# Patient Record
Sex: Female | Born: 1990 | Race: Black or African American | Hispanic: No | Marital: Single | Smoking: Never smoker
Health system: Southern US, Community
[De-identification: ages and names within clinical notes are randomized; demographics above are authoritative.]

## PROBLEM LIST (undated history)

## (undated) DIAGNOSIS — I82409 Acute embolism and thrombosis of unspecified deep veins of unspecified lower extremity: Secondary | ICD-10-CM

## (undated) DIAGNOSIS — J45909 Unspecified asthma, uncomplicated: Secondary | ICD-10-CM

## (undated) DIAGNOSIS — L509 Urticaria, unspecified: Secondary | ICD-10-CM

## (undated) HISTORY — DX: Urticaria, unspecified: L50.9

## (undated) HISTORY — PX: TONSILLECTOMY: SUR1361

## (undated) HISTORY — DX: Unspecified asthma, uncomplicated: J45.909

---

## 2012-11-24 ENCOUNTER — Ambulatory Visit (INDEPENDENT_AMBULATORY_CARE_PROVIDER_SITE_OTHER): Payer: BC Managed Care – PPO | Admitting: Internal Medicine

## 2012-11-24 ENCOUNTER — Ambulatory Visit: Payer: BC Managed Care – PPO

## 2012-11-24 VITALS — BP 110/74 | HR 97 | Temp 98.8°F | Resp 17 | Ht 63.0 in | Wt 187.0 lb

## 2012-11-24 DIAGNOSIS — S239XXA Sprain of unspecified parts of thorax, initial encounter: Secondary | ICD-10-CM

## 2012-11-24 DIAGNOSIS — R0602 Shortness of breath: Secondary | ICD-10-CM

## 2012-11-24 DIAGNOSIS — IMO0002 Reserved for concepts with insufficient information to code with codable children: Secondary | ICD-10-CM

## 2012-11-24 DIAGNOSIS — S161XXA Strain of muscle, fascia and tendon at neck level, initial encounter: Secondary | ICD-10-CM

## 2012-11-24 DIAGNOSIS — S139XXA Sprain of joints and ligaments of unspecified parts of neck, initial encounter: Secondary | ICD-10-CM

## 2012-11-24 MED ORDER — HYDROCODONE-ACETAMINOPHEN 5-325 MG PO TABS: 1.0000 | ORAL_TABLET | Freq: Four times a day (QID) | ORAL | Status: DC | PRN

## 2012-11-24 MED ORDER — METHOCARBAMOL 750 MG PO TABS: 750.0000 mg | ORAL_TABLET | Freq: Four times a day (QID) | ORAL | Status: DC

## 2012-11-24 NOTE — Progress Notes (Signed)
  Subjective:    Patient ID: Felicia Lawrence, female    DOB: Aug 22, 1991, 22 y.o.   MRN: [id number]  HPI 2days ago woke up with sharp pain at medial superior scapula. No hx of any trauma. Crying with pain, pain with neck, shoulder rom. Also pain often with deep breathing. No fever or illness. General health has always been good. No sob or cough   Review of Systems Neg    Objective:   Physical Exam  Constitutional: She is oriented to person, place, and time. She appears well-developed and well-nourished. She appears distressed.  Cardiovascular: Normal rate.   Pulmonary/Chest: Effort normal and breath sounds normal.  Musculoskeletal:       Cervical back: She exhibits pain and spasm. She exhibits normal range of motion, no tenderness and no bony tenderness.       Arms: Point tender at x.  Neurological: She is alert and oriented to person, place, and time. She exhibits normal muscle tone. Coordination normal.  Psychiatric: She has a normal mood and affect. Her behavior is normal. Thought content normal.  Spurling causes pain at x. Repeat temp 98.8 Crying with pain  [facility] reading (PRIMARY) by  Dr Perrin Maltese pleuritic subscapular pain cxr nad, possible cardiomegaly         Assessment & Plan:  Thoracic strain Pleuritic thoracic pain Robaxin/HC

## 2012-11-24 NOTE — Patient Instructions (Signed)
Torticollis, Acute You have suddenly (acutely) developed a twisted neck (torticollis). This is usually a self-limited condition. CAUSES  Acute torticollis may be caused by malposition, trauma or infection. Most commonly, acute torticollis is caused by sleeping in an awkward position. Torticollis may also be caused by the flexion, extension or twisting of the neck muscles beyond their normal position. Sometimes, the exact cause may not be known. SYMPTOMS  Usually, there is pain and limited movement of the neck. Your neck may twist to one side. DIAGNOSIS  The diagnosis is often made by physical examination. X-rays, CT scans or MRIs may be done if there is a history of trauma or concern of infection. TREATMENT  For a common, stiff neck that develops during sleep, treatment is focused on relaxing the contracted neck muscle. Medications (including shots) may be used to treat the problem. Most cases resolve in several days. Torticollis usually responds to conservative physical therapy. If left untreated, the shortened and spastic neck muscle can cause deformities in the face and neck. Rarely, surgery is required. HOME CARE INSTRUCTIONS   Use over-the-counter and prescription medications as directed by your caregiver.  Do stretching exercises and massage the neck as directed by your caregiver.  Follow up with physical therapy if needed and as directed by your caregiver. SEEK IMMEDIATE MEDICAL CARE IF:   You develop difficulty breathing or noisy breathing (stridor).  You drool, develop trouble swallowing or have pain with swallowing.  You develop numbness or weakness in the hands or feet.  You have changes in speech or vision.  You have problems with urination or bowel movements.  You have difficulty walking.  You have a fever.  You have increased pain. MAKE SURE YOU:   Understand these instructions.  Will watch your condition.  Will get help right away if you are not doing well or  get worse. Document Released: 09/24/2000 Document Revised: 12/20/2011 Document Reviewed: 11/05/2009 Exit[employer] Patient Information 2013 [employer].  

## 2014-06-13 IMAGING — CR DG CHEST 2V
2 series · 2 of 2 positions shown · non-contrast
Comparison: None.

CLINICAL DATA: Shortness of breath.  Pleuritic subscapular pain.

CHEST - 2 VIEW

[PA]
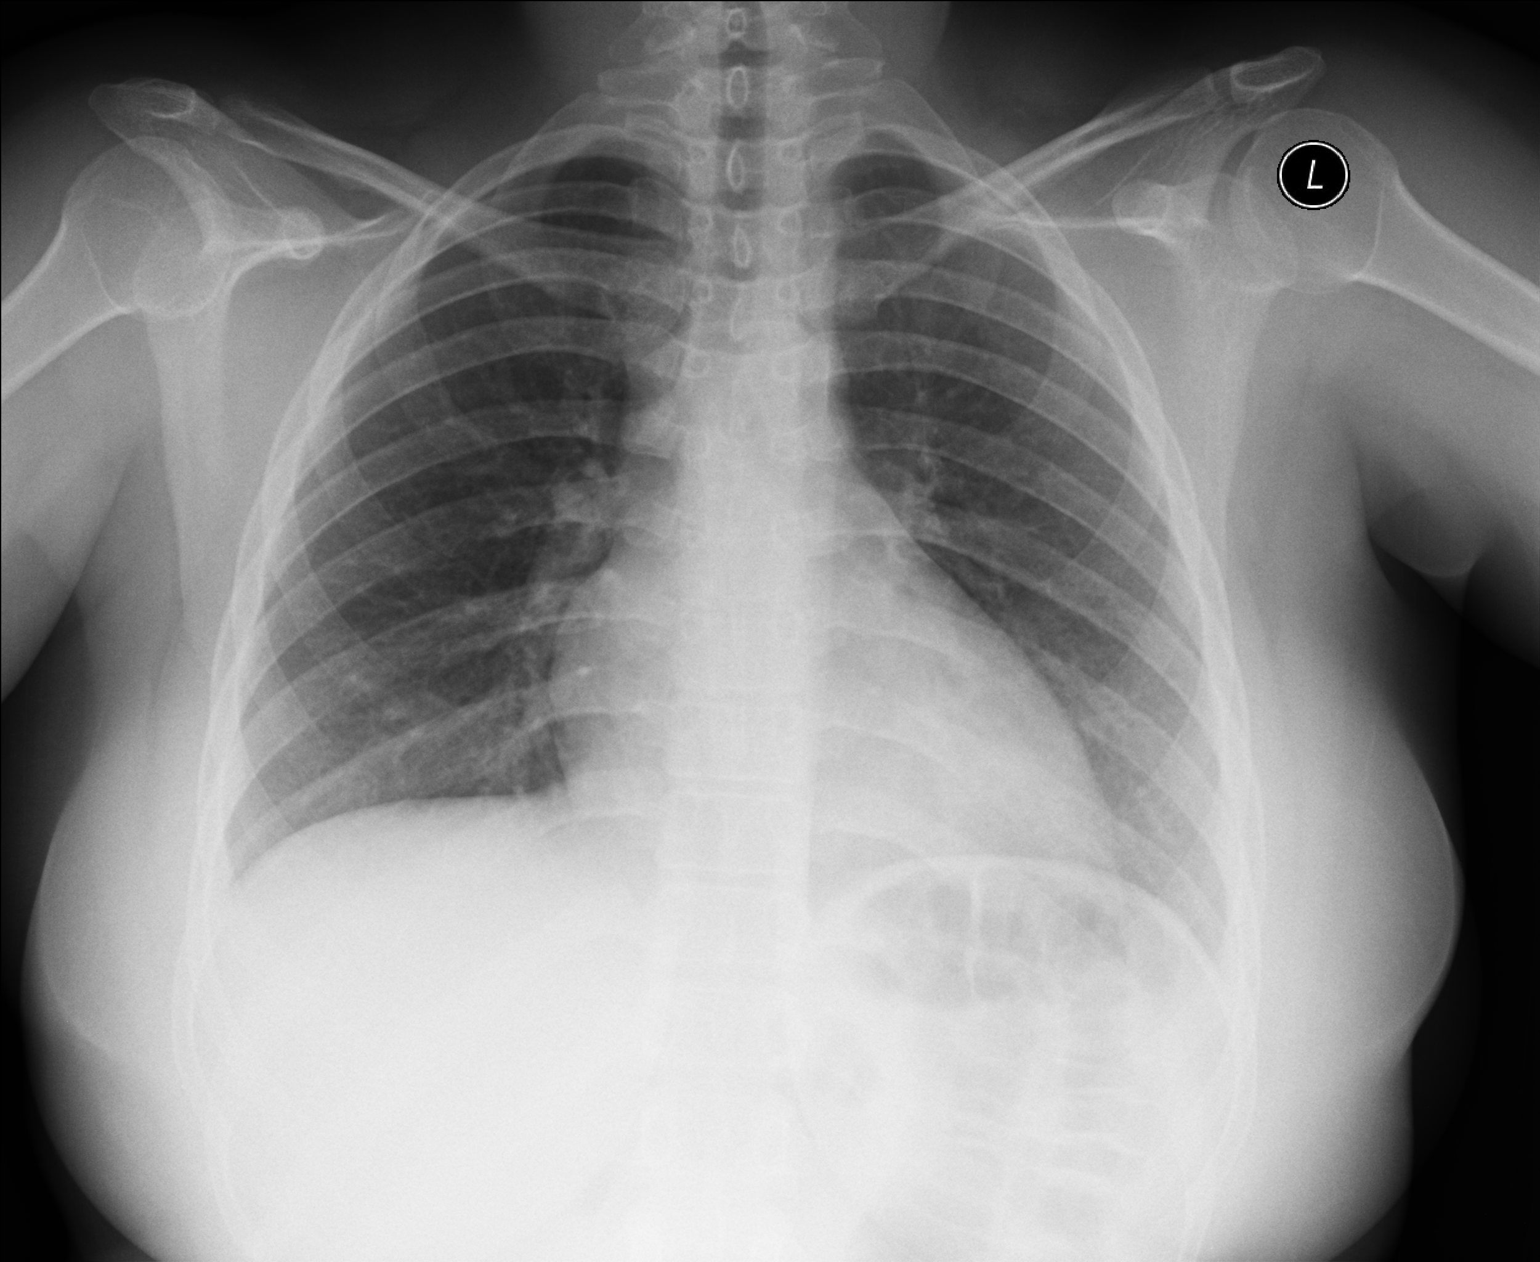

[lateral]
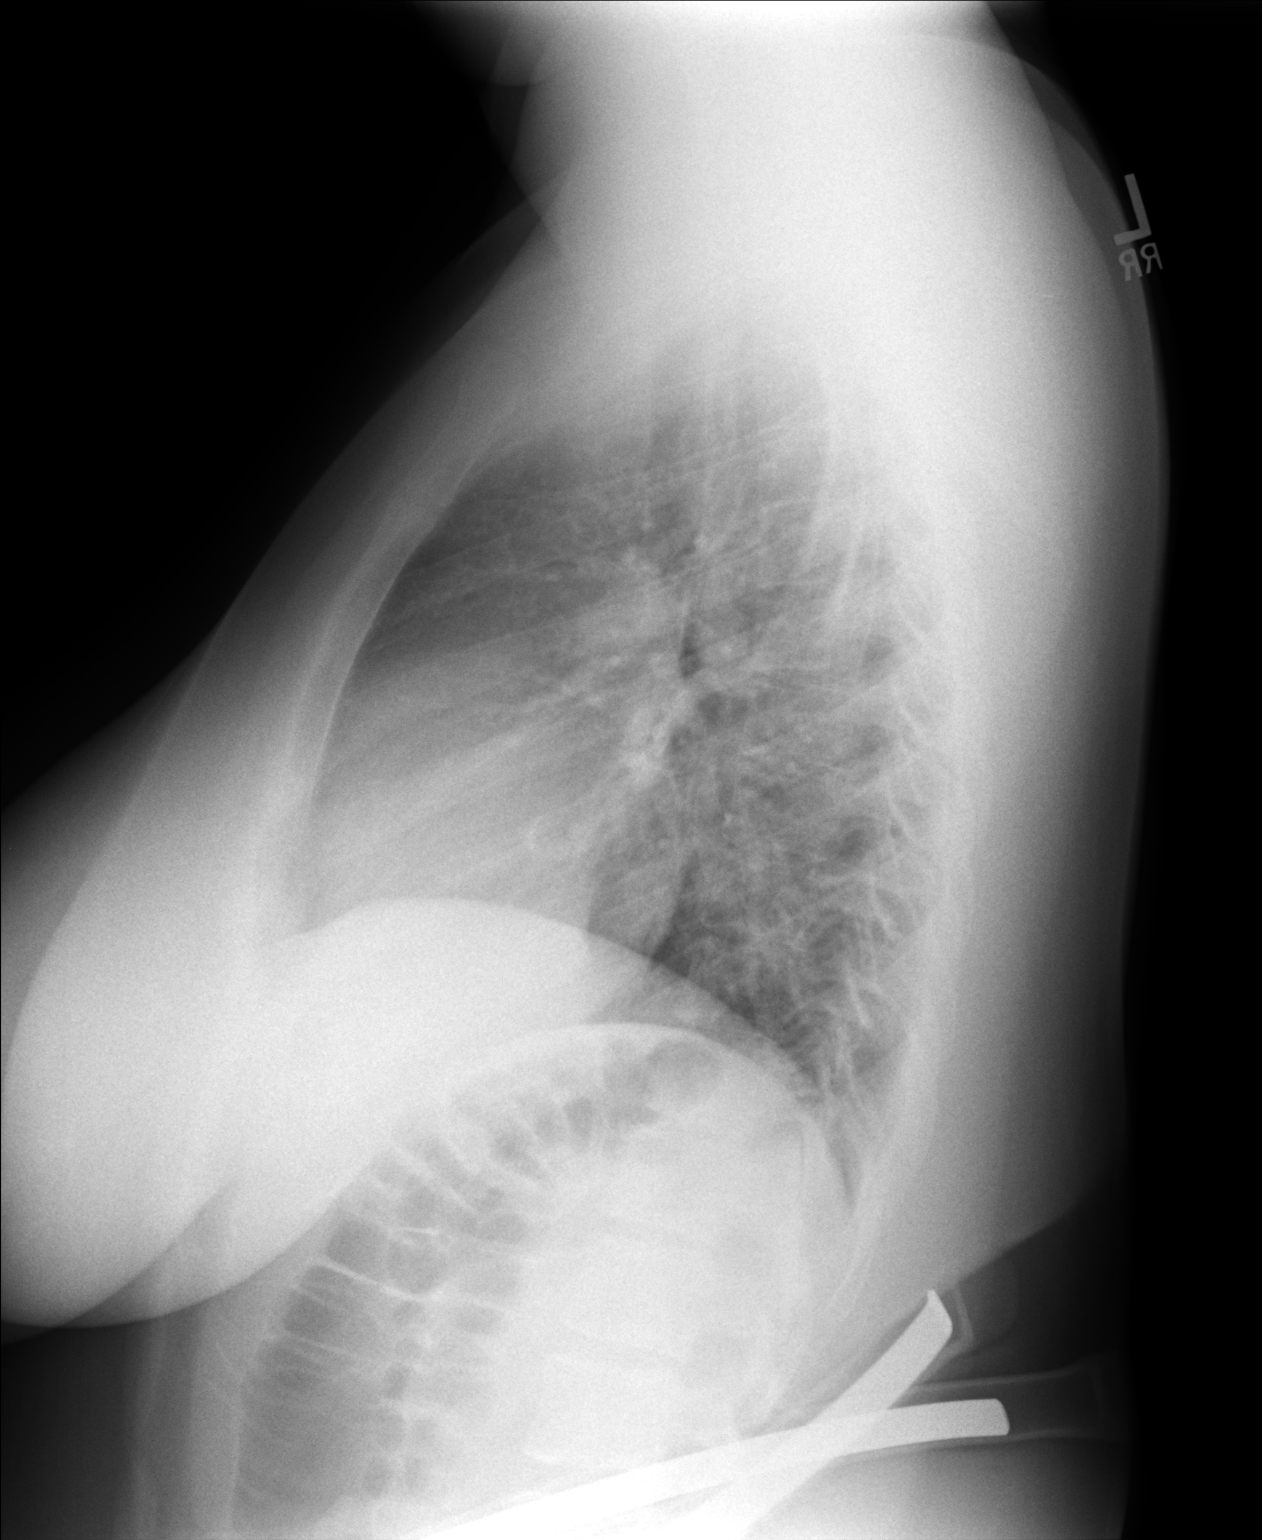

[2 of 2 positions shown; findings below may reference images not displayed]

FINDINGS: Cardiac silhouette is borderline size. No mediastinal or
hilar lesions are seen.  No pulmonary infiltrates or masses were
evident. No pleural abnormality is evident. Bones appear average
for age. .
IMPRESSION: Borderline size of cardiac silhouette.  No pulmonary, pleural, or
skeletal lesions are evident.

## 2017-04-14 ENCOUNTER — Emergency Department (HOSPITAL_COMMUNITY)
Admission: EM | Admit: 2017-04-14 | Discharge: 2017-04-14 | Disposition: A | Discharge status: Home / Self Care | Payer: 59 | Attending: Emergency Medicine | Admitting: Emergency Medicine

## 2017-04-14 ENCOUNTER — Encounter (HOSPITAL_COMMUNITY): Payer: Self-pay | Admitting: Emergency Medicine

## 2017-04-14 ENCOUNTER — Emergency Department (HOSPITAL_BASED_OUTPATIENT_CLINIC_OR_DEPARTMENT_OTHER)
Admit: 2017-04-14 | Discharge: 2017-04-14 | Disposition: A | Discharge status: Home / Self Care | Payer: 59 | Attending: Emergency Medicine | Admitting: Emergency Medicine

## 2017-04-14 DIAGNOSIS — M79661 Pain in right lower leg: Secondary | ICD-10-CM | POA: Diagnosis present

## 2017-04-14 DIAGNOSIS — M79609 Pain in unspecified limb: Secondary | ICD-10-CM

## 2017-04-14 DIAGNOSIS — I82401 Acute embolism and thrombosis of unspecified deep veins of right lower extremity: Secondary | ICD-10-CM

## 2017-04-14 LAB — COMPREHENSIVE METABOLIC PANEL
ALK PHOS: 54 U/L (ref 38–126)
ALT: 15 U/L (ref 14–54)
ANION GAP: 8 (ref 5–15)
AST: 17 U/L (ref 15–41)
Albumin: 3.6 g/dL (ref 3.5–5.0)
BILIRUBIN TOTAL: 0.4 mg/dL (ref 0.3–1.2)
BUN: 8 mg/dL (ref 6–20)
CALCIUM: 8.8 mg/dL — AB (ref 8.9–10.3)
CO2: 25 mmol/L (ref 22–32)
Chloride: 108 mmol/L (ref 101–111)
Creatinine, Ser: 0.62 mg/dL (ref 0.44–1.00)
GFR calc non Af Amer: >60 mL/min (ref >60)
Glucose, Bld: 95 mg/dL (ref 65–99)
POTASSIUM: 3.4 mmol/L — AB (ref 3.5–5.1)
SODIUM: 141 mmol/L (ref 135–145)
TOTAL PROTEIN: 6.6 g/dL (ref 6.5–8.1)

## 2017-04-14 LAB — CBC WITH DIFFERENTIAL/PLATELET
Basophils Absolute: 0 10*3/uL (ref 0.0–0.1)
Basophils Relative: 0 %
EOS ABS: 0.1 10*3/uL (ref 0.0–0.7)
Eosinophils Relative: 1 %
HEMATOCRIT: 38.7 % (ref 36.0–46.0)
HEMOGLOBIN: 12.7 g/dL (ref 12.0–15.0)
LYMPHS ABS: 2.1 10*3/uL (ref 0.7–4.0)
Lymphocytes Relative: 26 %
MCH: 29.7 pg (ref 26.0–34.0)
MCHC: 32.8 g/dL (ref 30.0–36.0)
MCV: 90.4 fL (ref 78.0–100.0)
MONO ABS: 0.5 10*3/uL (ref 0.1–1.0)
Monocytes Relative: 7 %
NEUTROS PCT: 66 %
Neutro Abs: 5.3 10*3/uL (ref 1.7–7.7)
Platelets: 212 10*3/uL (ref 150–400)
RBC: 4.28 MIL/uL (ref 3.87–5.11)
RDW: 12.2 % (ref 11.5–15.5)
WBC: 8.1 10*3/uL (ref 4.0–10.5)

## 2017-04-14 MED ORDER — RIVAROXABAN (XARELTO) VTE STARTER PACK (15 & 20 MG): ORAL_TABLET | ORAL | 0 refills | Status: DC

## 2017-04-14 MED ORDER — RIVAROXABAN 15 MG PO TABS
15.0000 mg | ORAL_TABLET | Freq: Once | ORAL | Status: AC
Start: 2017-04-14 — End: 2017-04-14
  Administered 2017-04-14: 15 mg via ORAL
  Filled 2017-04-14: qty 1

## 2017-04-14 MED ORDER — TRAMADOL HCL 50 MG PO TABS: 50.0000 mg | ORAL_TABLET | Freq: Four times a day (QID) | ORAL | 0 refills | Status: DC | PRN

## 2017-04-14 NOTE — ED Provider Notes (Signed)
[facility]-EMERGENCY DEPT Provider Note   CSN: [id number] Arrival date & time: 04/14/17  1439     History   Chief Complaint Chief Complaint  Patient presents with  . Leg Pain    HPI Felicia Lawrence is a 26 y.o. female.  Patient complains of swelling and pain in her right calf for a number days   The history is provided by the patient. No language interpreter was used.  Leg Pain   This is a new problem. The current episode started more than 2 days ago. The problem occurs constantly. The problem has not changed since onset.Pain location: Right calf. The quality of the pain is described as dull. The pain is at a severity of 5/10. The pain is moderate. Associated symptoms include numbness.    Past Medical History:  Diagnosis Date  . Asthma     There are no active problems to display for this patient.   History reviewed. No pertinent surgical history.  OB History    No data available       Home Medications    Prior to Admission medications   Medication Sig Start Date End Date Taking? Authorizing Provider  diclofenac (VOLTAREN) 50 MG EC tablet Take 50 mg by mouth 2 (two) times daily.   Yes [provider]  folic acid (FOLVITE) 1 MG tablet Take 1 mg by mouth daily.   Yes [provider]  Rivaroxaban 15 & 20 MG TBPK Take as directed on package: Start with one 15mg  tablet by mouth twice a day with food. On Day 22, switch to one 20mg  tablet once a day with food. 04/14/17   Bethann Berkshire, MD  traMADol (ULTRAM) 50 MG tablet Take 1 tablet (50 mg total) by mouth every 6 (six) hours as needed. 04/14/17   Bethann Berkshire, MD    Family History No family history on file.  Social History Social History  Substance Use Topics  . Smoking status: Never Smoker  . Smokeless tobacco: Never Used  . Alcohol use No     Allergies   Patient has no known allergies.   Review of Systems Review of Systems  Constitutional: Negative for appetite change and fatigue.  HENT:  Negative for congestion, ear discharge and sinus pressure.   Eyes: Negative for discharge.  Respiratory: Negative for cough.   Cardiovascular: Negative for chest pain.  Gastrointestinal: Negative for abdominal pain and diarrhea.  Genitourinary: Negative for frequency and hematuria.  Musculoskeletal: Negative for back pain.       Right calf pain  Skin: Negative for rash.  Neurological: Positive for numbness. Negative for seizures and headaches.  Psychiatric/Behavioral: Negative for hallucinations.     Physical Exam Updated Vital Signs BP 116/72 (BP Location: Right Arm)   Pulse 91   Temp 98.7 F (37.1 C) (Oral)   Resp 18   Ht 5\' 3"  (1.6 m)   Wt 80.7 kg (178 lb)   SpO2 100%   BMI 31.53 kg/m   Physical Exam  Constitutional: She is oriented to person, place, and time. She appears well-developed.  HENT:  Head: Normocephalic.  Eyes: Conjunctivae are normal.  Neck: No tracheal deviation present.  Cardiovascular:  No murmur heard. Musculoskeletal: Normal range of motion.  Tender right calf  Neurological: She is oriented to person, place, and time.  Skin: Skin is warm.  Psychiatric: She has a normal mood and affect.     ED Treatments / Results  Labs (all labs ordered are listed, but only abnormal results are  displayed) Labs Reviewed  COMPREHENSIVE METABOLIC PANEL - Abnormal; Notable for the following:       Result Value   Potassium 3.4 (*)    Calcium 8.8 (*)    All other components within normal limits  CBC WITH DIFFERENTIAL/PLATELET    EKG  EKG Interpretation None       Radiology No results found.  Procedures Procedures (including critical care time)  Medications Ordered in ED Medications  Rivaroxaban (XARELTO) tablet 15 mg (not administered)     Initial Impression / Assessment and Plan / ED Course  I have reviewed the triage vital signs and the nursing notes.  Pertinent labs & imaging results that were available during my care of the patient were  reviewed by me and considered in my medical decision making (see chart for details).     Ultrasound shows a DVT in the right calf. Patient will be started on 1210 she's told to go to her OB/GYN to take care of her birth control. Also patient is told to stop taking voltaren,   Pt to follow up with pcp or hematology Final Clinical Impressions(s) / ED Diagnoses   Final diagnoses:  Acute deep vein thrombosis (DVT) of right lower extremity, unspecified vein (HCC)    New Prescriptions New Prescriptions   RIVAROXABAN 15 & 20 MG TBPK    Take as directed on package: Start with one 15mg  tablet by mouth twice a day with food. On Day 22, switch to one 20mg  tablet once a day with food.   TRAMADOL (ULTRAM) 50 MG TABLET    Take 1 tablet (50 mg total) by mouth every 6 (six) hours as needed.     Bethann BerkshireZammit, Ediel Unangst, MD 04/14/17 Kristopher [id number]

## 2017-04-14 NOTE — Discharge Instructions (Signed)
Follow up with a family md next week or follow up with Dr. Darnelle CatalanMagrinat in 1-2 weeks.  Stop taking your voltaren

## 2017-04-14 NOTE — ED Notes (Signed)
Pt c.o right lower leg pain that started about a week ago. States the pain is worse when she walks on it and it relieved when elevated. Pt denies any recent long travels or smoking. Pt does have the nexplanon in place.

## 2017-04-14 NOTE — ED Triage Notes (Signed)
Pt. Stated, my right leg started huting about a week ago and the last 2 days its gotten worse.

## 2017-04-14 NOTE — ED Notes (Signed)
Pt in vascular.

## 2017-04-14 NOTE — Progress Notes (Signed)
VASCULAR LAB PRELIMINARY  PRELIMINARY  PRELIMINARY  PRELIMINARY  Right lower extremity venous duplex completed.    Preliminary report:  There is acute DVT noted throughout the right peroneal vein.    Called report to Dr. Areta HaberZammit  Neelie Welshans, [facility]AND[facility], RVT 04/14/2017, 5:53 PM

## 2017-04-15 ENCOUNTER — Other Ambulatory Visit: Payer: Self-pay | Admitting: Oncology

## 2017-10-10 ENCOUNTER — Emergency Department (HOSPITAL_COMMUNITY)
Admission: EM | Admit: 2017-10-10 | Discharge: 2017-10-10 | Disposition: A | Discharge status: Home / Self Care | Payer: 59

## 2017-10-10 ENCOUNTER — Other Ambulatory Visit: Payer: Self-pay

## 2017-10-11 ENCOUNTER — Emergency Department (HOSPITAL_BASED_OUTPATIENT_CLINIC_OR_DEPARTMENT_OTHER)
Admission: EM | Admit: 2017-10-11 | Discharge: 2017-10-11 | Disposition: A | Discharge status: Home / Self Care | Payer: 59 | Attending: Emergency Medicine | Admitting: Emergency Medicine

## 2017-10-11 ENCOUNTER — Encounter (HOSPITAL_BASED_OUTPATIENT_CLINIC_OR_DEPARTMENT_OTHER): Payer: Self-pay | Admitting: Emergency Medicine

## 2017-10-11 ENCOUNTER — Other Ambulatory Visit: Payer: Self-pay

## 2017-10-11 ENCOUNTER — Emergency Department (HOSPITAL_BASED_OUTPATIENT_CLINIC_OR_DEPARTMENT_OTHER): Payer: 59

## 2017-10-11 DIAGNOSIS — I824Z2 Acute embolism and thrombosis of unspecified deep veins of left distal lower extremity: Secondary | ICD-10-CM

## 2017-10-11 DIAGNOSIS — Z86718 Personal history of other venous thrombosis and embolism: Secondary | ICD-10-CM | POA: Insufficient documentation

## 2017-10-11 DIAGNOSIS — J45909 Unspecified asthma, uncomplicated: Secondary | ICD-10-CM | POA: Diagnosis not present

## 2017-10-11 DIAGNOSIS — Z79899 Other long term (current) drug therapy: Secondary | ICD-10-CM | POA: Insufficient documentation

## 2017-10-11 DIAGNOSIS — I82402 Acute embolism and thrombosis of unspecified deep veins of left lower extremity: Secondary | ICD-10-CM | POA: Insufficient documentation

## 2017-10-11 DIAGNOSIS — M79662 Pain in left lower leg: Secondary | ICD-10-CM | POA: Diagnosis present

## 2017-10-11 HISTORY — DX: Acute embolism and thrombosis of unspecified deep veins of unspecified lower extremity: I82.409

## 2017-10-11 MED ORDER — RIVAROXABAN (XARELTO) EDUCATION KIT FOR DVT/PE PATIENTS: PACK | Freq: Once | Status: DC

## 2017-10-11 MED ORDER — RIVAROXABAN 15 MG PO TABS
15.0000 mg | ORAL_TABLET | Freq: Once | ORAL | Status: AC
  Administered 2017-10-11: 15 mg via ORAL
  Filled 2017-10-11: qty 1

## 2017-10-11 MED ORDER — RIVAROXABAN (XARELTO) VTE STARTER PACK (15 & 20 MG): ORAL_TABLET | ORAL | 0 refills | Status: DC

## 2017-10-11 NOTE — ED Triage Notes (Signed)
L leg pain x 3 days. Hx of DVT in R leg.

## 2017-10-11 NOTE — ED Provider Notes (Signed)
[facility] EMERGENCY DEPARTMENT Provider Note   CSN: [id number] Arrival date & time: 10/11/17  1806     History   Chief Complaint Chief Complaint  Patient presents with  . Leg Pain    HPI Felicia Lawrence is a 27 y.o. female.  HPI  27 year old female presents with left calf pain starting 3 days ago.  Pain does not radiate anywhere and there is no knee or thigh pain.  It is posterior and she thinks it has been a little bit red.  She had a DVT about 6 months ago.  She was told to follow-up with a hematologist but when she called they told her that that particular doctor does not follow-up DVTs.  She took the Xarelto starter pack but did not take more than 1 month.  She does not have a primary care doctor.  She has not had any chest pain or shortness of breath.  No weakness or numbness.  She used to be on birth control but was taken off of this when she had the DVT the first time.  Her mom and her mother's father both had a history of blood clots.  Past Medical History:  Diagnosis Date  . Asthma   . DVT (deep venous thrombosis) ([place])     There are no active problems to display for this patient.   History reviewed. No pertinent surgical history.  OB History    No data available       Home Medications    Prior to Admission medications   Medication Sig Start Date End Date Taking? Authorizing Provider  folic acid (FOLVITE) 1 MG tablet Take 1 mg by mouth daily.    [provider]  Rivaroxaban 15 & 20 MG TBPK Take as directed on package: Start with one 57m tablet by mouth twice a day with food. On Day 22, switch to one 28mtablet once a day with food. 10/11/17   GoSherwood GamblerMD  traMADol (ULTRAM) 50 MG tablet Take 1 tablet (50 mg total) by mouth every 6 (six) hours as needed. 04/14/17   ZaMilton FergusonMD    Family History No family history on file.  Social History Social History   Tobacco Use  . Smoking status: Never Smoker  . Smokeless tobacco:  Never Used  Substance Use Topics  . Alcohol use: No  . Drug use: No     Allergies   Patient has no known allergies.   Review of Systems Review of Systems  Respiratory: Negative for shortness of breath.   Cardiovascular: Negative for chest pain and leg swelling.  Musculoskeletal: Positive for myalgias.  Skin: Positive for color change.  Neurological: Negative for weakness and numbness.  All other systems reviewed and are negative.    Physical Exam Updated Vital Signs BP 113/75 (BP Location: Right Arm)   Pulse 81   Temp 98.2 F (36.8 C) (Oral)   Resp 18   Ht 5' 3" (1.6 m)   Wt 74.8 kg (165 lb)   LMP 09/27/2017   SpO2 99%   BMI 29.23 kg/m   Physical Exam  Constitutional: She is oriented to person, place, and time. She appears well-developed and well-nourished.  HENT:  Head: Normocephalic and atraumatic.  Right Ear: External ear normal.  Left Ear: External ear normal.  Nose: Nose normal.  Eyes: Right eye exhibits no discharge. Left eye exhibits no discharge.  Cardiovascular: Normal rate and regular rhythm.  Pulses:      Dorsalis pedis pulses  are 2+ on the right side, and 2+ on the left side.  Pulmonary/Chest: Effort normal.  Musculoskeletal:       Left knee: She exhibits no swelling. No tenderness found.       Left upper leg: She exhibits no tenderness and no swelling.       Left lower leg: She exhibits tenderness and swelling. She exhibits no bony tenderness.       Legs: Neurological: She is alert and oriented to person, place, and time.  Skin: Skin is warm and dry.  Nursing note and vitals reviewed.    ED Treatments / Results  Labs (all labs ordered are listed, but only abnormal results are displayed) Labs Reviewed - No data to display  EKG  EKG Interpretation None       Radiology US Venous Img Lower Unilateral Left  Result Date: 10/11/2017 CLINICAL DATA:  Left calf pain for 3 days. EXAM: LEFT LOWER EXTREMITY VENOUS DOPPLER ULTRASOUND TECHNIQUE:  Gray-scale sonography with graded compression, as well as color Doppler and duplex ultrasound were performed to evaluate the lower extremity deep venous systems from the level of the common femoral vein and including the common femoral, femoral, profunda femoral, popliteal and calf veins including the posterior tibial, peroneal and gastrocnemius veins when visible. The superficial great saphenous vein was also interrogated. Spectral Doppler was utilized to evaluate flow at rest and with distal augmentation maneuvers in the common femoral, femoral and popliteal veins. COMPARISON:  None. FINDINGS: Contralateral Common Femoral Vein: Respiratory phasicity is normal and symmetric with the symptomatic side. No evidence of thrombus. Normal compressibility. Common Femoral Vein: No evidence of thrombus. Normal compressibility, respiratory phasicity and response to augmentation. Saphenofemoral Junction: No evidence of thrombus. Normal compressibility and flow on color Doppler imaging. Profunda Femoral Vein: No evidence of thrombus. Normal compressibility and flow on color Doppler imaging. Femoral Vein: No evidence of thrombus. Normal compressibility, respiratory phasicity and response to augmentation. Popliteal Vein: No evidence of thrombus. Normal compressibility, respiratory phasicity and response to augmentation. Calf Veins: No evidence of thrombus. Normal compressibility and flow on color Doppler imaging. Venous Reflux:  None. Other Findings: There is thrombus in the gastrocnemius seen at the junction with popliteal vein extending to the upper to mid calf. IMPRESSION: Positive thrombus in the left gastrocnemius vein seen at the junction with the popliteal vein extending to the upper to mid calf. Electronically Signed   By: Abelardo Diesel M.D.   On: 10/11/2017 19:35    Procedures Procedures (including critical care time)  Medications Ordered in ED Medications  rivaroxaban [facility] Reasons) Education Kit for DVT/PE patients  (not administered)  Rivaroxaban (XARELTO) tablet 15 mg (15 mg Oral Given 10/11/17 2138)     Initial Impression / Assessment and Plan / ED Course  I have reviewed the triage vital signs and the nursing notes.  Pertinent labs & imaging results that were available during my care of the patient were reviewed by me and considered in my medical decision making (see chart for details).     Patient presents with a recurrent clot.  While this clot is distal in the gastrocnemius vein, I think that she needs treatment with Xarelto given this is a recurrent clot.  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  She did not get fully treated as she only used 1 month of a blood thinner last time.  No clear etiology at this time as she has not had any travel, surgery not  on estrogen.  This could be hereditary given family history.  She does not have any signs or symptoms concerning for PE at this time.  She was given another starter pack for Xarelto and given resources to help find a PCP.  Discussed return precautions.  Final Clinical Impressions(s) / ED Diagnoses   Final diagnoses:  DVT, lower extremity, distal, acute, left [facility])    ED Discharge Orders        Ordered    Rivaroxaban 15 & 20 MG TBPK     10/11/17 2128       Sherwood Gambler, MD 10/11/17 2146

## 2017-10-11 NOTE — Discharge Instructions (Signed)
It is very important to find a primary care doctor to help workup why you are having blood clots.  Use the number provided in your discharge paperwork to help find a doctor.  If you develop chest pain or shortness of breath return to the ER or call 911 to be seen immediately.  Do not take aspirin, ibuprofen, Motrin, Aleve, or other NSAIDs while you are on this blood thinner.  You need to find a doctor ASAP because you likely need to be on this blood thinner for at least 3 or 6 months.

## 2017-10-12 MED FILL — XARELTO STARTER PACK: 15 & 20 | 30 days supply | Qty: 51 | Fill #0

## 2017-10-13 ENCOUNTER — Ambulatory Visit: Payer: 59 | Admitting: Medical

## 2017-10-13 ENCOUNTER — Encounter: Payer: Self-pay | Admitting: Medical

## 2017-10-13 VITALS — BP 111/65 | HR 79 | Temp 98.4°F | Resp 16 | Ht 63.0 in | Wt 167.6 lb

## 2017-10-13 DIAGNOSIS — I824Y2 Acute embolism and thrombosis of unspecified deep veins of left proximal lower extremity: Secondary | ICD-10-CM

## 2017-10-13 MED ORDER — RIVAROXABAN 20 MG PO TABS: 20.0000 mg | ORAL_TABLET | Freq: Every day | ORAL | 1 refills | Status: DC

## 2017-10-13 NOTE — Progress Notes (Signed)
Subjective:    Patient ID: Felicia Lawrence, female    DOB: 04/10/1991, 27 y.o.   MRN: [id number]  HPI   Pt in for first time.  Pt works Biochemist, clinicaldetention officer, she works out 3 days a week, nonsmoker, no alcohol. Not married.  LMP- 2 weeks and normal.(no birth control tablets). Came on at expected normal.  Pt in states in past July she had DVT. In the past she was on xarelto for about a month and half. She never followed up with hematologist. In  July ED summary reads.  Ultrasound shows a DVT in the right calf. Patient will be started on 1210 she's told to go to her OB/GYN to take care of her birth control. Also patient is told to stop taking voltaren,   Pt to follow up with pcp or hematology.    Then just recently she had some pain in her left calf for 2-3 days. She state pain in her calf with just walking. So she went to ED and was evaluated The most recent US read possible DVT. She was then put back on xarelto starter pack for one month.   Positive thrombus in the left gastrocnemius vein seen at the junction with the popliteal vein extending to the upper to mid calf.   Pt has been off ocp since summer. No recent trauma, no long trips and not a smoker. Mom had dvt and is on coumadin.    Review of Systems  Constitutional: Negative for chills, diaphoresis and fatigue.  HENT: Negative for congestion.   Respiratory: Negative for cough, chest tightness, shortness of breath and wheezing.   Cardiovascular: Negative for chest pain and palpitations.  Gastrointestinal: Negative for abdominal pain, diarrhea and nausea.  Musculoskeletal: Negative for back pain, gait problem and neck stiffness.       Less pain in calf but some less than when in ED.  Skin: Negative for rash.  Neurological: Negative for dizziness, seizures, weakness and headaches.  Hematological: Negative for adenopathy. Does not bruise/bleed easily.  Psychiatric/Behavioral: Negative for behavioral problems and confusion.     Past Medical History:  Diagnosis Date  . Asthma   . DVT (deep venous thrombosis) (HCC)      Social History   Socioeconomic History  . Marital status: Single    Spouse name: Not on file  . Number of children: Not on file  . Years of education: Not on file  . Highest education level: Not on file  Social Needs  . Financial resource strain: Not on file  . Food insecurity - worry: Not on file  . Food insecurity - inability: Not on file  . Transportation needs - medical: Not on file  . Transportation needs - non-medical: Not on file  Occupational History  . Not on file  Tobacco Use  . Smoking status: Never Smoker  . Smokeless tobacco: Never Used  Substance and Sexual Activity  . Alcohol use: No  . Drug use: No  . Sexual activity: Yes    Birth control/protection: None  Other Topics Concern  . Not on file  Social History Narrative  . Not on file    No past surgical history on file.  Family History  Problem Relation Age of Onset  . Clotting disorder Mother     No Known Allergies  Current Outpatient Medications on File Prior to Visit  Medication Sig Dispense Refill  . folic acid (FOLVITE) 1 MG tablet Take 1 mg by mouth daily.    .Marland Kitchen  Rivaroxaban 15 & 20 MG TBPK Take as directed on package: Start with one 15mg  tablet by mouth twice a day with food. On Day 22, switch to one 20mg  tablet once a day with food. 51 each 0  . traMADol (ULTRAM) 50 MG tablet Take 1 tablet (50 mg total) by mouth every 6 (six) hours as needed. 15 tablet 0   No current facility-administered medications on file prior to visit.     BP 111/65   Pulse 79   Temp 98.4 F (36.9 C) (Oral)   Resp 16   Ht 5\' 3"  (1.6 m)   Wt 167 lb 9.6 oz (76 kg)   LMP 09/27/2017   SpO2 100%   BMI 29.69 kg/m       Objective:   Physical Exam  General- No acute distress. Pleasant patient. Neck- Full range of motion, no jvd Lungs- Clear, even and unlabored. Heart- regular rate and rhythm. Neurologic- CNII-  XII grossly intact.  Left lower ext- faint tender mid lower calf. Not swollen, no warm or tender. Mild pain in this area on homans testing.  Rt leg- no calf tenderness and negative homan sign.           Assessment & Plan:  For your history of DVT recently and in the past, I want you to continue with the Xarelto starter pack but go ahead and try to fill the prescription of Xarelto I gave you today.  You would use my prescription as you run out of current pack.  I will also will go ahead and refer you to hematology for workup of cause of recurrent DVTs as we do not have recent suspicious history for known causes.  You will likely get workup to determine the cause.  I have placed a referral but if no one calls you within 10-14 days then recommend you call here and asked to speak to [facility].  She cannot investigate/update you on the referral.  Follow-up with me as needed before after specialist appointment.  Abegail Kloeppel, Ramon Dredge, PA-C

## 2017-10-13 NOTE — Patient Instructions (Addendum)
For your history of DVT recently and in the past, I want you to continue with the Xarelto starter pack but go ahead and try to fill the prescription of Xarelto I gave you today.  You would use my prescription as you run out of current pack.  I will also will go ahead and refer you to hematology for workup of cause of recurrent DVTs as you  do not have recent suspicious history for known causes.  You will likely get workup to determine the cause.  I have placed a referral but if no one calls you within 10-14 days then recommend you call here and [facility].  She cannot investigate/update you on the referral.  Follow-up with me as needed before after specialist appointment.

## 2017-11-01 ENCOUNTER — Other Ambulatory Visit: Payer: 59

## 2017-11-01 ENCOUNTER — Ambulatory Visit: Payer: 59 | Admitting: Family

## 2017-11-14 ENCOUNTER — Ambulatory Visit: Payer: 59 | Admitting: Medical

## 2017-11-18 ENCOUNTER — Inpatient Hospital Stay: Payer: 59 | Attending: Family | Admitting: Hematology & Oncology

## 2017-11-18 ENCOUNTER — Inpatient Hospital Stay: Payer: 59

## 2017-11-18 ENCOUNTER — Other Ambulatory Visit: Payer: Self-pay | Admitting: Family

## 2017-11-18 ENCOUNTER — Other Ambulatory Visit: Payer: Self-pay

## 2017-11-18 ENCOUNTER — Encounter: Payer: Self-pay | Admitting: Hematology & Oncology

## 2017-11-18 VITALS — BP 109/72 | HR 66 | Temp 98.4°F | Resp 18 | Wt 168.0 lb

## 2017-11-18 DIAGNOSIS — Z832 Family history of diseases of the blood and blood-forming organs and certain disorders involving the immune mechanism: Secondary | ICD-10-CM | POA: Insufficient documentation

## 2017-11-18 DIAGNOSIS — Z79899 Other long term (current) drug therapy: Secondary | ICD-10-CM | POA: Insufficient documentation

## 2017-11-18 DIAGNOSIS — Z86718 Personal history of other venous thrombosis and embolism: Secondary | ICD-10-CM

## 2017-11-18 DIAGNOSIS — Z7901 Long term (current) use of anticoagulants: Secondary | ICD-10-CM | POA: Diagnosis not present

## 2017-11-18 DIAGNOSIS — K645 Perianal venous thrombosis: Secondary | ICD-10-CM | POA: Diagnosis not present

## 2017-11-18 DIAGNOSIS — I824Z2 Acute embolism and thrombosis of unspecified deep veins of left distal lower extremity: Secondary | ICD-10-CM | POA: Insufficient documentation

## 2017-11-18 LAB — CMP (CANCER CENTER ONLY)
ALK PHOS: 59 U/L (ref 40–150)
ALT: 33 U/L (ref 0–55)
ANION GAP: 8 (ref 3–11)
AST: 24 U/L (ref 5–34)
Albumin: 3.7 g/dL (ref 3.5–5.0)
BUN: 12 mg/dL (ref 7–26)
CALCIUM: 8.9 mg/dL (ref 8.4–10.4)
CO2: 27 mmol/L (ref 22–29)
Chloride: 106 mmol/L (ref 98–109)
Creatinine: 0.64 mg/dL (ref 0.60–1.10)
GFR, Estimated: >60 mL/min (ref >60)
GLUCOSE: 80 mg/dL (ref 70–140)
POTASSIUM: 3.9 mmol/L (ref 3.5–5.1)
SODIUM: 141 mmol/L (ref 136–145)
Total Bilirubin: 0.3 mg/dL (ref 0.2–1.2)
Total Protein: 6.7 g/dL (ref 6.4–8.3)

## 2017-11-18 LAB — CBC WITH DIFFERENTIAL (CANCER CENTER ONLY)
BASOS PCT: 0 %
Basophils Absolute: 0 10*3/uL (ref 0.0–0.1)
Eosinophils Absolute: 0.1 10*3/uL (ref 0.0–0.5)
Eosinophils Relative: 2 %
HEMATOCRIT: 35.9 % (ref 34.8–46.6)
HEMOGLOBIN: 11.7 g/dL (ref 11.6–15.9)
LYMPHS PCT: 32 %
Lymphs Abs: 1.6 10*3/uL (ref 0.9–3.3)
MCH: 29.4 pg (ref 26.0–34.0)
MCHC: 32.6 g/dL (ref 32.0–36.0)
MCV: 90.2 fL (ref 81.0–101.0)
MONO ABS: 0.4 10*3/uL (ref 0.1–0.9)
Monocytes Relative: 8 %
NEUTROS ABS: 3 10*3/uL (ref 1.5–6.5)
NEUTROS PCT: 58 %
Platelet Count: 243 10*3/uL (ref 145–400)
RBC: 3.98 MIL/uL (ref 3.70–5.32)
RDW: 11.7 % (ref 11.1–15.7)
WBC Count: 5.2 10*3/uL (ref 3.9–10.0)

## 2017-11-18 LAB — ANTITHROMBIN III: AntiThromb III Func: 95 % (ref 75–120)

## 2017-11-18 MED ORDER — RIVAROXABAN 20 MG PO TABS: 20.0000 mg | ORAL_TABLET | Freq: Every day | ORAL | 12 refills | Status: DC

## 2017-11-18 MED FILL — XARELTO 20 MG TABLET: 20 | 30 days supply | Qty: 30 | Fill #0

## 2017-11-18 NOTE — Progress Notes (Signed)
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  Chief Complaint  Patient presents with  . New Patient (Initial Visit)  : I am had blood clots in my legs.  HPI: Felicia Lawrence is a very nice 27 year old African-American female.  [place]  She [place] is working [facility] [employer] [place].  She is a Biochemist, clinicaldetention officer.  She is in very good shape.  She exercises.  She is never smoked.  She had been on contraceptive medication with a implantable drug.  Back in July 2018, she woke up and had some pain in the right leg.  She thought that she may have pulled a muscle.  This did not get better.  She ultimately went to urgent care.  She had a Doppler done.  Surprisingly, she had a thrombus in the RIGHT peroneal vein.  She was placed on Xarelto.  However, there is some Ms. communication with her.  She was not given any follow-up.  She only took the Xarelto starter pack.  At that time, she had the implantable contraceptive drug removed.  Of note, she has never been pregnant.  There is a history of blood clots with her mother and grandfather.  She then had pain and swelling in the left leg.  This was around Christmas time.  This did not improve on its own.  She subsequently had another Doppler done.  This time, she had a thrombus in the LEFT gastrocnemius vein.  She was again placed on Xarelto.  However, [facility] was [facility].  She is feeling okay.  There is no pain in the legs.  She has had no swelling in the legs.  She does have her monthly cycles.  They are not too heavy.  She has had no cough or shortness of breath.  She has had no chest wall pain.  She does not smoke.  She does enjoy a glass of wine on occasion.  There is no history of diabetes.  She has not had any recent  trips.  There is really no past surgery.  There is no history of sickle cell.  Currently, her performance status is ECOG 0.   Past Medical History:  Diagnosis Date  . Asthma    only as a child.  Marland Kitchen. DVT (deep venous thrombosis) (HCC)   :  History reviewed. No pertinent surgical history.:   Current Outpatient Medications:  .  folic acid (FOLVITE) 1 MG tablet, Take 1 mg by mouth daily., Disp: , Rfl:  .  rivaroxaban (XARELTO) 20 MG TABS tablet, Take 1 tablet (20 mg total) by mouth daily with supper., Disp: 30 tablet, Rfl: 12 .  Rivaroxaban 15 & 20 MG TBPK, Take as directed on package: Start with one 15mg  tablet by mouth twice a day with food. On Day 22, switch to one 20mg  tablet once a day with food., Disp: 51 each, Rfl: 0 .  traMADol (ULTRAM) 50 MG tablet, Take 1 tablet (50 mg total) by mouth every 6 (six) hours as needed., Disp: 15 tablet, Rfl: 0:  :  No Known Allergies:  Family History  Problem Relation Age of Onset  . Clotting disorder Mother   :  Social History   Socioeconomic History  . Marital status: Single    Spouse name: Not on file  . Number of children: Not  on file  . Years of education: Not on file  . Highest education level: Not on file  Social Needs  . Financial resource strain: Not on file  . Food insecurity - worry: Not on file  . Food insecurity - inability: Not on file  . Transportation needs - medical: Not on file  . Transportation needs - non-medical: Not on file  Occupational History  . Not on file  Tobacco Use  . Smoking status: Never Smoker  . Smokeless tobacco: Never Used  Substance and Sexual Activity  . Alcohol use: No  . Drug use: No  . Sexual activity: Yes    Birth control/protection: None  Other Topics Concern  . Not on file  Social History Narrative  . Not on file  :  Review of Systems  Constitutional: Negative.   HENT: Negative.   Eyes: Negative.   Respiratory: Negative.   Cardiovascular: Negative.   Gastrointestinal:  Negative.   Genitourinary: Negative.   Musculoskeletal: Negative.   Skin: Negative.  Negative for rash.  Neurological: Negative.   Endo/Heme/Allergies: Negative.   Psychiatric/Behavioral: Negative.      Exam: Well-developed and well-nourished African-American female in no obvious distress.  Vital signs show a temperature of 98.4.  Pulse 66.  Blood pressure 109/72.  Weight is 168 pounds.  Head neck exam shows no ocular or oral lesions.  She has no palpable cervical or supraclavicular lymph nodes.  Lungs are clear bilaterally.  Cardiac exam regular rate and rhythm with no murmurs, rubs or bruits.  Abdomen is soft.  She has good bowel sounds.  There is no fluid wave.  There is no palpable liver or spleen tip.  Back exam shows no tenderness over the spine, ribs or hips.  Extremities shows no clubbing, cyanosis or edema.  She has good range of motion of her joints.  No palpable venous cord is noted.  She has a negative Homans sign bilaterally.  Skin exam shows no rashes, ecchymoses or petechia.  Neurological exam shows no focal neurological deficits. @IPVITALS @   Recent Labs    11/18/17 1252  WBC 5.2  HCT 35.9  PLT 243   No results for input(s): NA, K, CL, CO2, GLUCOSE, BUN, CREATININE, CALCIUM in the last 72 hours.  Blood smear review: None  Pathology: None    Assessment and Plan: Felicia Lawrence is a very nice 27 year old African-American female.  She initially presented with a thrombus in the right lower leg back in July 2018.  Again, she was only on anticoagulation for 6 weeks.  She then presented with a second thrombus in the left lower leg.  It is hard to say whether or not she really needs lifelong anticoagulation.  She was on estrogens with the initial blood clot.  This could have been the risk factor.  However, these were taken away.  There is a family history of thromboembolic disease.  As such, we will send off a thrombophilic panel to see if we can find any factor that may  have precipitated this blood clot.  I would keep her on full dose anticoagulation for 6 months.  As such, she would complete 6 months of anticoagulation in August.  After that, I would then put her on maintenance anticoagulation for a year.  I would like to get a Doppler of her legs when we see her back.  I think this would be helpful.  I spent about 45 minutes with her.  Over 50% of the time was spent face-to-face  with her.  I reviewed the labs.  I reviewed the Dopplers.  I made my recommendations.  I am worried that she might be at higher risk for post phlebitic syndrome.  Because she is young and active, I think we have to give her a compression stocking to apply to the left leg so that she has a minimal risk of post phlebitic syndrome.  She is in agreement.  I gave her a prescription for her to take to a medical supply store to have a compression stocking fitted.  I told her to wear this while she is working and to take it off when she is in bed.  We will plan to get her back in 8 weeks.  I will get a Doppler when we see her back.

## 2017-11-21 LAB — LUPUS ANTICOAGULANT PANEL
DRVVT: 33 s (ref 0.0–47.0)
PTT Lupus Anticoagulant: 41.5 s (ref 0.0–51.9)

## 2017-11-21 LAB — PROTEIN C ACTIVITY: Protein C Activity: 117 % (ref 73–180)

## 2017-11-21 LAB — PROTEIN S ACTIVITY: Protein S Activity: 21 % — ABNORMAL LOW (ref 63–140)

## 2017-11-21 LAB — PROTEIN S, TOTAL: PROTEIN S AG TOTAL: 35 % — AB (ref 60–150)

## 2017-11-22 LAB — BETA-2-GLYCOPROTEIN I ABS, IGG/M/A: Beta-2-Glycoprotein I IgM: <9 GPI IgM units (ref 0–32)

## 2017-11-22 LAB — CARDIOLIPIN ANTIBODIES, IGG, IGM, IGA
Anticardiolipin IgA: <9 APL U/mL (ref 0–11)
Anticardiolipin IgG: <9 GPL U/mL (ref 0–14)
Anticardiolipin IgM: 9 MPL U/mL (ref 0–12)

## 2017-11-22 LAB — PROTEIN C, TOTAL: PROTEIN C, TOTAL: 85 % (ref 60–150)

## 2017-11-22 LAB — PROTHROMBIN GENE MUTATION

## 2017-11-24 ENCOUNTER — Other Ambulatory Visit: Payer: Self-pay | Admitting: Family

## 2017-11-24 DIAGNOSIS — I82401 Acute embolism and thrombosis of unspecified deep veins of right lower extremity: Secondary | ICD-10-CM

## 2017-11-28 LAB — HGB FRAC. W/SOLUBILITY
HGB A2: 2.6 % (ref 1.8–3.2)
HGB A: 97.4 % (ref 96.4–98.8)
HGB C: 0 %
HGB F: 0 % (ref 0.0–2.0)
HGB S: 0 %
HGB SOLUBILITY: NEGATIVE
HGB VARIANT: 0 %

## 2017-12-22 MED FILL — XARELTO 20 MG TABLET: 20 | 30 days supply | Qty: 30 | Fill #1

## 2018-01-19 ENCOUNTER — Inpatient Hospital Stay: Payer: 59 | Attending: Family | Admitting: Family

## 2018-01-19 ENCOUNTER — Other Ambulatory Visit: Payer: Self-pay

## 2018-01-19 ENCOUNTER — Encounter: Payer: Self-pay | Admitting: Family

## 2018-01-19 ENCOUNTER — Ambulatory Visit (HOSPITAL_BASED_OUTPATIENT_CLINIC_OR_DEPARTMENT_OTHER)
Admission: RE | Admit: 2018-01-19 | Discharge: 2018-01-19 | Disposition: A | Discharge status: Home / Self Care | Payer: 59 | Source: Ambulatory Visit | Attending: Family | Admitting: Family

## 2018-01-19 ENCOUNTER — Inpatient Hospital Stay: Payer: 59

## 2018-01-19 VITALS — BP 114/71 | HR 66 | Temp 98.4°F | Resp 16 | Wt 172.0 lb

## 2018-01-19 DIAGNOSIS — Z7901 Long term (current) use of anticoagulants: Secondary | ICD-10-CM | POA: Diagnosis not present

## 2018-01-19 DIAGNOSIS — I824Z2 Acute embolism and thrombosis of unspecified deep veins of left distal lower extremity: Secondary | ICD-10-CM | POA: Diagnosis not present

## 2018-01-19 DIAGNOSIS — Z86718 Personal history of other venous thrombosis and embolism: Secondary | ICD-10-CM | POA: Diagnosis not present

## 2018-01-19 DIAGNOSIS — D6859 Other primary thrombophilia: Secondary | ICD-10-CM

## 2018-01-19 DIAGNOSIS — I82401 Acute embolism and thrombosis of unspecified deep veins of right lower extremity: Secondary | ICD-10-CM

## 2018-01-19 LAB — CBC WITH DIFFERENTIAL (CANCER CENTER ONLY)
Basophils Absolute: 0 10*3/uL (ref 0.0–0.1)
Basophils Relative: 0 %
Eosinophils Absolute: 0.1 10*3/uL (ref 0.0–0.5)
Eosinophils Relative: 1 %
HCT: 35.5 % (ref 34.8–46.6)
HEMOGLOBIN: 11.6 g/dL (ref 11.6–15.9)
Lymphocytes Relative: 37 %
Lymphs Abs: 1.8 10*3/uL (ref 0.9–3.3)
MCH: 29.5 pg (ref 26.0–34.0)
MCHC: 32.7 g/dL (ref 32.0–36.0)
MCV: 90.3 fL (ref 81.0–101.0)
MONOS PCT: 12 %
Monocytes Absolute: 0.6 10*3/uL (ref 0.1–0.9)
NEUTROS PCT: 50 %
Neutro Abs: 2.4 10*3/uL (ref 1.5–6.5)
Platelet Count: 219 10*3/uL (ref 145–400)
RBC: 3.93 MIL/uL (ref 3.70–5.32)
RDW: 11.7 % (ref 11.1–15.7)
WBC: 4.9 10*3/uL (ref 3.9–10.0)

## 2018-01-19 LAB — CMP (CANCER CENTER ONLY)
ALK PHOS: 50 U/L (ref 38–126)
ALT: 22 U/L (ref 10–47)
AST: 21 U/L (ref 11–38)
Albumin: 3.8 g/dL (ref 3.5–5.0)
Anion gap: 9 (ref 5–15)
BUN: 15 mg/dL (ref 6–20)
CHLORIDE: 105 mmol/L (ref 101–111)
CO2: 26 mmol/L (ref 22–32)
CREATININE: 0.53 mg/dL — AB (ref 0.60–1.20)
Calcium: 8.7 mg/dL — ABNORMAL LOW (ref 8.9–10.3)
GLUCOSE: 84 mg/dL (ref 65–99)
Potassium: 3.7 mmol/L (ref 3.5–5.1)
SODIUM: 140 mmol/L (ref 135–145)
Total Bilirubin: 0.5 mg/dL (ref 0.2–1.6)
Total Protein: 6.8 g/dL (ref 6.5–8.1)

## 2018-01-19 NOTE — Progress Notes (Signed)
  Bilateral lower extremity venous duplex. No DVT seen  Dorothey BasemanReel, Chazlyn Cude M 01/19/2018, 12:18 PM

## 2018-01-19 NOTE — Progress Notes (Signed)
Hematology and Oncology Follow Up Visit  Felicia Lawrence [id number] 01/19/1991 27 y.o. 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 She is doing well and has no complaints at this time. She had a repeat lower extremity US today and results are pending.  She has done well on Xarelto and has had no issues with bleeding. Her cycles was a little heavier that first month but has now returned to normal. No bruising or petechiae.  No fever, chills, n/v, cough, rash, dizziness, SOB, chest pain, palpitations, abdominal pain or changes in bowel or bladder habits.  No lymphadenopathy found on exam.  She has had no swelling, tenderness, numbness or tingling in her extremities.  She does a lot of walking at work.  She has maintained a good appetite and is staying well hydrated. Her weight is stable.   ECOG Performance Status: 1 - Symptomatic but completely ambulatory  Medications:  Allergies as of 01/19/2018   No Known Allergies     Medication List        Accurate as of 01/19/18  1:25 PM. Always use your most recent med list.          folic acid 1 MG tablet Commonly known as:  FOLVITE Take 1 mg by mouth daily.   Rivaroxaban 15 & 20 MG Tbpk Take as directed on package: Start with one 15mg  tablet by mouth twice a day with food. On Day 22, switch to one 20mg  tablet once a day with food.   rivaroxaban 20 MG Tabs tablet Commonly known as:  XARELTO Take 1 tablet (20 mg total) by mouth daily with supper.   traMADol 50 MG tablet Commonly known as:  ULTRAM Take 1 tablet (50 mg total) by mouth every 6 (six) hours as needed.       Allergies: No Known Allergies  Past Medical History, Surgical history, Social history, and Family  History were reviewed and updated.  Review of Systems: All other 10 point review of systems is negative.   Physical Exam:  weight is 172 lb (78 kg). Her oral temperature is 98.4 F (36.9 C). Her blood pressure is 114/71 and her pulse is 66. Her respiration is 16 and oxygen saturation is 100%.   Wt Readings from Last 3 Encounters:  01/19/18 172 lb (78 kg)  11/18/17 168 lb (76.2 kg)  10/13/17 167 lb 9.6 oz (76 kg)    Ocular: Sclerae unicteric, pupils equal, round and reactive to light Ear-nose-throat: Oropharynx clear, dentition fair Lymphatic: No cervical, supraclavicular or axillary adenopathy Lungs no rales or rhonchi, good excursion bilaterally Heart regular rate and rhythm, no murmur appreciated Abd soft, nontender, positive bowel sounds, no liver or spleen tip palpated on exam, no fluid wave  MSK no focal spinal tenderness, no joint edema Neuro: non-focal, well-oriented, appropriate affect Breasts: Deferred   Lab Results  Component Value Date   WBC 4.9 01/19/2018   HGB 12.7 04/14/2017   HCT 35.5 01/19/2018   MCV 90.3 01/19/2018   PLT 219 01/19/2018   No results found for: FERRITIN, IRON, TIBC, UIBC, IRONPCTSAT Lab Results  Component Value Date   RBC 3.93 01/19/2018   No results found for: KPAFRELGTCHN, LAMBDASER, KAPLAMBRATIO No results found  for: IGGSERUM, IGA, IGMSERUM No results found for: Marda Stalker, SPEI   Chemistry      Component Value Date/Time   NA 141 11/18/2017 1251   K 3.9 11/18/2017 1251   CL 106 11/18/2017 1251   CO2 27 11/18/2017 1251   BUN 12 11/18/2017 1251   CREATININE 0.64 11/18/2017 1251      Component Value Date/Time   CALCIUM 8.9 11/18/2017 1251   ALKPHOS 59 11/18/2017 1251   AST 24 11/18/2017 1251   ALT 33 11/18/2017 1251   BILITOT 0.3 11/18/2017 1251      Impression and Plan: Ms. Ryback is a very pleasant 27 yo African American female with history DVT in the right lower  extremity in 2018 and now most recently a DVT in the left lower extremity. She was found to have protein S deficiency.  She is off of birth control and doing well on Xarelto. She will transition to maintenance in August.   We will plan to see her back in another 3 months.  She will contact our office with any questions or concerns. We can certainly see her sooner if need be.    Emeline Gins, NP 4/11/20191:25 PM

## 2018-01-20 LAB — HOMOCYSTEINE: HOMOCYSTEINE-NORM: 9.1 umol/L (ref 0.0–15.0)

## 2018-01-24 ENCOUNTER — Telehealth: Payer: Self-pay | Admitting: Family

## 2018-01-24 NOTE — Telephone Encounter (Signed)
Called and gave Felicia Lawrence her US results. We discussed her protein S deficiency. All questions for now were answered. We will see her back again in late summer.

## 2018-02-16 MED FILL — XARELTO 20 MG TABLET: 20 | 30 days supply | Qty: 30 | Fill #2

## 2018-04-06 MED FILL — XARELTO 20 MG TABLET: 20 | 30 days supply | Qty: 30 | Fill #3

## 2018-04-26 ENCOUNTER — Inpatient Hospital Stay: Payer: 59 | Attending: Family | Admitting: Family

## 2018-04-26 ENCOUNTER — Encounter: Payer: Self-pay | Admitting: Family

## 2018-04-26 ENCOUNTER — Other Ambulatory Visit: Payer: Self-pay

## 2018-04-26 ENCOUNTER — Inpatient Hospital Stay: Payer: 59

## 2018-04-26 VITALS — BP 109/65 | HR 73 | Temp 98.2°F | Resp 20 | Wt 171.1 lb

## 2018-04-26 DIAGNOSIS — I82401 Acute embolism and thrombosis of unspecified deep veins of right lower extremity: Secondary | ICD-10-CM

## 2018-04-26 DIAGNOSIS — I824Z2 Acute embolism and thrombosis of unspecified deep veins of left distal lower extremity: Secondary | ICD-10-CM | POA: Diagnosis present

## 2018-04-26 DIAGNOSIS — D6859 Other primary thrombophilia: Secondary | ICD-10-CM | POA: Insufficient documentation

## 2018-04-26 DIAGNOSIS — Z86718 Personal history of other venous thrombosis and embolism: Secondary | ICD-10-CM | POA: Insufficient documentation

## 2018-04-26 DIAGNOSIS — Z7901 Long term (current) use of anticoagulants: Secondary | ICD-10-CM | POA: Insufficient documentation

## 2018-04-26 LAB — CMP (CANCER CENTER ONLY)
ALBUMIN: 3.7 g/dL (ref 3.5–5.0)
ALK PHOS: 54 U/L (ref 38–126)
ALT: 16 U/L (ref 0–44)
ANION GAP: 6 (ref 5–15)
AST: 16 U/L (ref 15–41)
BILIRUBIN TOTAL: 0.3 mg/dL (ref 0.3–1.2)
BUN: 11 mg/dL (ref 6–20)
CALCIUM: 9.2 mg/dL (ref 8.9–10.3)
CO2: 29 mmol/L (ref 22–32)
CREATININE: 0.71 mg/dL (ref 0.44–1.00)
Chloride: 108 mmol/L (ref 98–111)
GFR, Est AFR Am: >60 mL/min (ref >60)
GFR, Estimated: >60 mL/min (ref >60)
GLUCOSE: 95 mg/dL (ref 70–99)
Potassium: 4.3 mmol/L (ref 3.5–5.1)
Sodium: 143 mmol/L (ref 135–145)
TOTAL PROTEIN: 6.5 g/dL (ref 6.5–8.1)

## 2018-04-26 LAB — CBC WITH DIFFERENTIAL (CANCER CENTER ONLY)
Basophils Absolute: 0 10*3/uL (ref 0.0–0.1)
Basophils Relative: 0 %
Eosinophils Absolute: 0.1 10*3/uL (ref 0.0–0.5)
Eosinophils Relative: 1 %
HEMATOCRIT: 37.5 % (ref 34.8–46.6)
HEMOGLOBIN: 12 g/dL (ref 11.6–15.9)
LYMPHS ABS: 2.2 10*3/uL (ref 0.9–3.3)
Lymphocytes Relative: 38 %
MCH: 29.3 pg (ref 26.0–34.0)
MCHC: 32 g/dL (ref 32.0–36.0)
MCV: 91.5 fL (ref 81.0–101.0)
MONOS PCT: 12 %
Monocytes Absolute: 0.7 10*3/uL (ref 0.1–0.9)
NEUTROS ABS: 2.9 10*3/uL (ref 1.5–6.5)
NEUTROS PCT: 49 %
Platelet Count: 240 10*3/uL (ref 145–400)
RBC: 4.1 MIL/uL (ref 3.70–5.32)
RDW: 12 % (ref 11.1–15.7)
WBC Count: 5.9 10*3/uL (ref 3.9–10.0)

## 2018-04-26 MED ORDER — RIVAROXABAN 10 MG PO TABS: 10.0000 mg | ORAL_TABLET | Freq: Every day | ORAL | 11 refills | Status: DC

## 2018-04-26 NOTE — Progress Notes (Signed)
Hematology and Oncology Follow Up Visit  Felicia Lawrence [id number] 18-Dec-1990 27 y.o. 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 She is doing well and has no complaints at this time.  She still has a heavy cycle on the Xarelto. No other episodes of bleeding, no bruising or petechiae. [place] in April showed no evidence of DVT in her lower extremities.  No fever, chills, n/v, cough, rash, dizziness, SOB, chest pain, palpitations, abdominal pain or changes in bowel or bladder habits.  No swelling, tenderness, numbness or tingling in her extremities. No c/o pain.  No lymphadenopathy noted on exam.  She has maintained a good appetite and is staying well hydrated. Her weight is stable.   ECOG Performance Status: 1 - Symptomatic but completely ambulatory  Medications:  Allergies as of 04/26/2018   No Known Allergies     Medication List        Accurate as of 04/26/18  9:18 AM. Always use your most recent med list.          folic acid 1 MG tablet Commonly known as:  FOLVITE Take 1 mg by mouth daily.   Rivaroxaban 15 & 20 MG Tbpk Take as directed on package: Start with one 15mg  tablet by mouth twice a day with food. On Day 22, switch to one 20mg  tablet once a day with food.   rivaroxaban 20 MG Tabs tablet Commonly known as:  XARELTO Take 1 tablet (20 mg total) by mouth daily with supper.   traMADol 50 MG tablet Commonly known as:  ULTRAM Take 1 tablet (50 mg total) by mouth every 6 (six) hours as needed.       Allergies: No Known Allergies  Past Medical History, Surgical history, Social history, and Family History were reviewed and updated.  Review of Systems: All other 10 point review of systems is negative.    Physical Exam:  vitals were not taken for this visit.   Wt Readings from Last 3 Encounters:  01/19/18 172 lb (78 kg)  11/18/17 168 lb (76.2 kg)  10/13/17 167 lb 9.6 oz (76 kg)    Ocular: Sclerae unicteric, pupils equal, round and reactive to light Ear-nose-throat: Oropharynx clear, dentition fair Lymphatic: No cervical, supraclavicular or axillary adenopathy Lungs no rales or rhonchi, good excursion bilaterally Heart regular rate and rhythm, no murmur appreciated Abd soft, nontender, positive bowel sounds, no liver or spleen tip palpated on exam, no fluid wave  MSK no focal spinal tenderness, no joint edema Neuro: non-focal, well-oriented, appropriate affect Breasts: Deferred   Lab Results  Component Value Date   WBC 5.9 04/26/2018   HGB 12.0 04/26/2018   HCT 37.5 04/26/2018   MCV 91.5 04/26/2018   PLT 240 04/26/2018   No results found for: FERRITIN, IRON, TIBC, UIBC, IRONPCTSAT Lab Results  Component Value Date   RBC 4.10 04/26/2018   No results found for: KPAFRELGTCHN, LAMBDASER, KAPLAMBRATIO No results found for: IGGSERUM, IGA, IGMSERUM No results found for: TOTALPROTELP, ALBUMINELP, A1GS, A2GS, BETS, BETA2SER, GAMS, MSPIKE, SPEI   Chemistry      Component Value Date/Time   NA 140 01/19/2018 1302   K 3.7 01/19/2018 1302   CL 105 01/19/2018 1302   CO2  26 01/19/2018 1302   BUN 15 01/19/2018 1302   CREATININE 0.53 (L) 01/19/2018 1302      Component Value Date/Time   CALCIUM 8.7 (L) 01/19/2018 1302   ALKPHOS 50 01/19/2018 1302   AST 21 01/19/2018 1302   ALT 22 01/19/2018 1302   BILITOT 0.5 01/19/2018 1302      Impression and Plan: [name] is a very pleasant 27 yo caucasian female with protein S deficiency with history of DVT in the right lower extremity in 2018 and now most recently a DVT in the left lower extremity.  She is doing well on Xarelto and US in April showed resolution of DVT.  She will start maintenance Xarelto 10 mg PO daily in August  once she finishes her current bottle of 20 mg PO daily tablets. Prescription was changed today.  We will plan to see her in another 4 months for follow-up.  She will contact our office with any questions or concerns. We can certainly see her sooner if need be.   Emeline GinsSarah [place], NP 7/17/20199:18 AM

## 2018-05-04 ENCOUNTER — Encounter: Payer: Self-pay | Admitting: Medical

## 2018-05-04 ENCOUNTER — Telehealth: Payer: Self-pay | Admitting: *Deleted

## 2018-05-04 ENCOUNTER — Ambulatory Visit: Payer: 59 | Admitting: Medical

## 2018-05-04 VITALS — BP 124/71 | HR 92 | Temp 98.6°F | Resp 16 | Ht 63.0 in | Wt 167.4 lb

## 2018-05-04 DIAGNOSIS — T7840XA Allergy, unspecified, initial encounter: Secondary | ICD-10-CM

## 2018-05-04 DIAGNOSIS — T63461A Toxic effect of venom of wasps, accidental (unintentional), initial encounter: Secondary | ICD-10-CM

## 2018-05-04 DIAGNOSIS — L089 Local infection of the skin and subcutaneous tissue, unspecified: Secondary | ICD-10-CM

## 2018-05-04 MED ORDER — CEPHALEXIN 500 MG PO CAPS: 500.0000 mg | ORAL_CAPSULE | Freq: Two times a day (BID) | ORAL | 0 refills | Status: DC

## 2018-05-04 MED ORDER — PREDNISONE 10 MG PO TABS: ORAL_TABLET | ORAL | 0 refills | Status: DC

## 2018-05-04 MED ORDER — HYDROXYZINE HCL 10 MG PO TABS: 10.0000 mg | ORAL_TABLET | Freq: Three times a day (TID) | ORAL | 0 refills | Status: DC | PRN

## 2018-05-04 MED FILL — predniSONE 10 MG TABS: 10 | 4 days supply | Qty: 10 | Fill #0

## 2018-05-04 MED FILL — CEPHALEXIN 500 MG CAPSULE: 500 | 7 days supply | Qty: 14 | Fill #0

## 2018-05-04 MED FILL — hydrOXYzine HCL 10 MG TABS: 10 | 10 days supply | Qty: 30 | Fill #0

## 2018-05-04 NOTE — Patient Instructions (Addendum)
You do appear to have lingering residual allergic reaction to right thigh in wasp sting site.  Some probable mild early secondary bacterial infection.  I am prescribing 4day's low-dose taper prednisone course.  In addition for itching providing low-dose hydroxyzine if needed for severe itching.  For possible early secondary skin infection I prescribed Keflex antibiotic.  We will watch the area and see if area gradually improved.  Any worsening or changes let us know.  Follow-up in 7-10 days or as needed.

## 2018-05-04 NOTE — Telephone Encounter (Signed)
Patient has a bee sting to her right leg which is swollen, feels hot occasionally and is bruised. This sting occurred last Thursday and the symptoms are not getting better. She wants to know if she should be seen due to being on Eliquis.  Spoke with Emeline GinsSarah [place] NP and she would like patient to follow up with her PCP. Bruising is normal after a bee sting, but the other symptoms may indicate an infection at the site. Instructed patient to call her PCP for an appointment to assess the site for infection. She understood.

## 2018-05-04 NOTE — Progress Notes (Signed)
Subjective:    Patient ID: Felicia Lawrence, female    DOB: 1991/07/21, 27 y.o.   MRN: [id number]  HPI  Pt in states she got stung by wasp about one week ago. The area is itching. She states itching is getting worse. Now the area feels warm now just recenlty over last day.  No sob or wheezing noted.  No fever, no chills, no sweats.   Pt states first time she ever got stung by Wasp.   LMP- April 14, 2018.     Review of Systems  Constitutional: Negative for chills, fatigue and fever.  Respiratory: Negative for cough, chest tightness, shortness of breath and wheezing.   Cardiovascular: Negative for chest pain and palpitations.  Gastrointestinal: Negative for abdominal pain.  Skin: Positive for rash.       See hpi.    Past Medical History:  Diagnosis Date  . Asthma    only as a child.  Marland Kitchen DVT (deep venous thrombosis) (HCC)      Social History   Socioeconomic History  . Marital status: Single    Spouse name: Not on file  . Number of children: Not on file  . Years of education: Not on file  . Highest education level: Not on file  Occupational History  . Not on file  Social Needs  . Financial resource strain: Not on file  . Food insecurity:    Worry: Not on file    Inability: Not on file  . Transportation needs:    Medical: Not on file    Non-medical: Not on file  Tobacco Use  . Smoking status: Never Smoker  . Smokeless tobacco: Never Used  Substance and Sexual Activity  . Alcohol use: No  . Drug use: No  . Sexual activity: Yes    Birth control/protection: None  Lifestyle  . Physical activity:    Days per week: Not on file    Minutes per session: Not on file  . Stress: Not on file  Relationships  . Social connections:    Talks on phone: Not on file    Gets together: Not on file    Attends religious service: Not on file    Active member of club or organization: Not on file    Attends meetings of clubs or organizations: Not on file    Relationship  status: Not on file  . Intimate partner violence:    Fear of current or ex partner: Not on file    Emotionally abused: Not on file    Physically abused: Not on file    Forced sexual activity: Not on file  Other Topics Concern  . Not on file  Social History Narrative  . Not on file    No past surgical history on file.  Family History  Problem Relation Age of Onset  . Clotting disorder Mother     No Known Allergies  Current Outpatient Medications on File Prior to Visit  Medication Sig Dispense Refill  . folic acid (FOLVITE) 1 MG tablet Take 1 mg by mouth daily.    . rivaroxaban (XARELTO) 10 MG TABS tablet Take 1 tablet (10 mg total) by mouth daily with supper. 30 tablet 11   No current facility-administered medications on file prior to visit.     BP 124/71   Pulse 92   Temp 98.6 F (37 C) (Oral)   Resp 16   Ht 5\' 3"  (1.6 m)   Wt 167 lb 6.4 oz (75.9 kg)  SpO2 100%   BMI 29.65 kg/m       Objective:   Physical Exam  General- No acute distress. Pleasant patien Lungs- Clear, even and unlabored. Heart- regular rate and rhythm. Neurologic- CNII- XII grossly intact.  Rt thigh- medial /distal  aspect1.5 cm circular faint red area. Mild raised. Faint warmth. Not tender. No induration or fluctuance.       Assessment & Plan:  You do appear to have lingering residual allergic reaction to right thigh in wasp sting site.  Some probable mild early secondary bacterial infection.  I am prescribing 4day's low-dose taper prednisone course.  In addition for itching providing low-dose hydroxyzine if needed for severe itching.  For possible early secondary skin infection I prescribed Keflex antibiotic.  We will watch the area and see if area gradually improved.  Any worsening or changes let us know.  Follow-up in 7-10 days or as needed.  Esperanza RichtersEdward Deserae Jennings, PA-C

## 2018-06-26 MED FILL — XARELTO 10 MG TABLET: 10 | 30 days supply | Qty: 30 | Fill #0

## 2018-08-23 ENCOUNTER — Other Ambulatory Visit: Payer: 59

## 2018-08-23 ENCOUNTER — Ambulatory Visit: Payer: 59 | Admitting: Family

## 2018-08-31 ENCOUNTER — Inpatient Hospital Stay: Payer: 59 | Attending: Hematology & Oncology

## 2018-08-31 ENCOUNTER — Inpatient Hospital Stay (HOSPITAL_BASED_OUTPATIENT_CLINIC_OR_DEPARTMENT_OTHER): Payer: 59 | Admitting: Family

## 2018-08-31 VITALS — BP 114/65 | HR 67 | Temp 98.3°F | Resp 18 | Ht 63.0 in | Wt 164.8 lb

## 2018-08-31 DIAGNOSIS — D6859 Other primary thrombophilia: Secondary | ICD-10-CM | POA: Diagnosis not present

## 2018-08-31 DIAGNOSIS — Z86718 Personal history of other venous thrombosis and embolism: Secondary | ICD-10-CM

## 2018-08-31 DIAGNOSIS — Z7901 Long term (current) use of anticoagulants: Secondary | ICD-10-CM | POA: Insufficient documentation

## 2018-08-31 DIAGNOSIS — I82401 Acute embolism and thrombosis of unspecified deep veins of right lower extremity: Secondary | ICD-10-CM

## 2018-08-31 LAB — CBC WITH DIFFERENTIAL (CANCER CENTER ONLY)
ABS IMMATURE GRANULOCYTES: 0.01 10*3/uL (ref 0.00–0.07)
BASOS PCT: 1 %
Basophils Absolute: 0 10*3/uL (ref 0.0–0.1)
Eosinophils Absolute: 0 10*3/uL (ref 0.0–0.5)
Eosinophils Relative: 0 %
HCT: 38.1 % (ref 36.0–46.0)
Hemoglobin: 11.9 g/dL — ABNORMAL LOW (ref 12.0–15.0)
IMMATURE GRANULOCYTES: 0 %
Lymphocytes Relative: 30 %
Lymphs Abs: 1.8 10*3/uL (ref 0.7–4.0)
MCH: 28.3 pg (ref 26.0–34.0)
MCHC: 31.2 g/dL (ref 30.0–36.0)
MCV: 90.7 fL (ref 80.0–100.0)
MONO ABS: 0.6 10*3/uL (ref 0.1–1.0)
MONOS PCT: 9 %
NEUTROS ABS: 3.5 10*3/uL (ref 1.7–7.7)
NEUTROS PCT: 60 %
PLATELETS: 233 10*3/uL (ref 150–400)
RBC: 4.2 MIL/uL (ref 3.87–5.11)
RDW: 11.9 % (ref 11.5–15.5)
WBC: 5.9 10*3/uL (ref 4.0–10.5)
nRBC: 0 % (ref 0.0–0.2)

## 2018-08-31 MED FILL — XARELTO 10 MG TABLET: 10 | 30 days supply | Qty: 30 | Fill #1

## 2018-08-31 NOTE — Progress Notes (Signed)
Hematology and Oncology Follow Up Visit  Silas SacramentoDemeisha Chappuis [id number] 08/06/1991 27 y.o. 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 is here today for follow-up. She is doing well and has no complaints at this time. She is doing well on Xarelto 10 mg PO daily. She started the maintenance dose in October.  Her cycle is regular and healy the first 2 days. No other bleeding, no bruising or petechiae.  She denies fatigue.  No fever, chills, n/v, cough, rash, dizziness, SOB, chest pain, palpitations, abdominal pain or changed in bowel or bladder habits.  No swelling, tenderness, numbness or tingling in her extremities. No c/o pain.  No lymphadenopathy noted on exam.  She has maintained a good appetite and is staying well hydrated. Her weight is stable.   ECOG Performance Status: 0 - Asymptomatic  Medications:  Allergies as of 08/31/2018   No Known Allergies     Medication List        Accurate as of 08/31/18  3:09 PM. Always use your most recent med list.          folic acid 1 MG tablet Commonly known as:  FOLVITE Take 1 mg by mouth daily.   hydrOXYzine 10 MG tablet Commonly known as:  ATARAX/VISTARIL Take 1 tablet (10 mg total) by mouth 3 (three) times daily as needed for itching.   rivaroxaban 10 MG Tabs tablet Commonly known as:  XARELTO Take 1 tablet (10 mg total) by mouth daily with supper.       Allergies: No Known Allergies  Past Medical History, Surgical history, Social history, and Family History were reviewed and updated.  Review of Systems: All other 10 point review of systems is negative.   Physical Exam:  height is 5\' 3"  (1.6 m) and weight is 164 lb 12.8 oz (74.8 kg). Her oral temperature is 98.3 F (36.8 C). Her blood pressure is 114/65 and her pulse is 67. Her respiration is  18 and oxygen saturation is 100%.   Wt Readings from Last 3 Encounters:  08/31/18 164 lb 12.8 oz (74.8 kg)  05/04/18 167 lb 6.4 oz (75.9 kg)  04/26/18 171 lb 1.9 oz (77.6 kg)    Ocular: Sclerae unicteric, pupils equal, round and reactive to light Ear-nose-throat: Oropharynx clear, dentition fair Lymphatic: No cervical, supraclavicular or axillary adenopathy Lungs no rales or rhonchi, good excursion bilaterally Heart regular rate and rhythm, no murmur appreciated Abd soft, nontender, positive bowel sounds, no liver or spleen tip palpated on exam, no fluid wave  MSK no focal spinal tenderness, no joint edema Neuro: non-focal, well-oriented, appropriate affect Breasts: Deferred   Lab Results  Component Value Date   WBC 5.9 08/31/2018   HGB 11.9 (L) 08/31/2018   HCT 38.1 08/31/2018   MCV 90.7 08/31/2018   PLT 233 08/31/2018   No results found for: FERRITIN, IRON, TIBC, UIBC, IRONPCTSAT Lab Results  Component Value Date   RBC 4.20 08/31/2018   No results found for: KPAFRELGTCHN, LAMBDASER, KAPLAMBRATIO No results found for: IGGSERUM, IGA, IGMSERUM No results found for: Dorene ArOTALPROTELP, ALBUMINELP, A1GS, A2GS, Colin BentonBETS, BETA2SER, GAMS, MSPIKE, SPEI   Chemistry      Component Value Date/Time   NA 143 04/26/2018 0907   K 4.3 04/26/2018 0907   CL 108 04/26/2018 0907   CO2 29 04/26/2018 0907  BUN 11 04/26/2018 0907   CREATININE 0.71 04/26/2018 0907      Component Value Date/Time   CALCIUM 9.2 04/26/2018 0907   ALKPHOS 54 04/26/2018 0907   AST 16 04/26/2018 0907   ALT 16 04/26/2018 0907   BILITOT 0.3 04/26/2018 6962       Impression and Plan: Ms. Macchia is a very pleasant 27 yo African American female with protein S deficiency with history of DVT in the right lower extremity in 2018 and DVT of the left lower extremity. [place] in April showed no evidence of DVT in wither lower extremity.  She continues to tolerate treatment with Xarelto and is now on Maintenance therapy through  October 2020.  We will plan to see her in another 4 months.  She will contact our office with any questions or concerns. We can certainly see her sooner if need be.   Emeline Gins, NP 11/21/20193:09 PM

## 2018-09-01 LAB — CMP (CANCER CENTER ONLY)
ALK PHOS: 54 U/L (ref 38–126)
ALT: 16 U/L (ref 0–44)
ANION GAP: 8 (ref 5–15)
AST: 20 U/L (ref 15–41)
Albumin: 4 g/dL (ref 3.5–5.0)
BILIRUBIN TOTAL: 0.4 mg/dL (ref 0.3–1.2)
BUN: 11 mg/dL (ref 6–20)
CO2: 27 mmol/L (ref 22–32)
CREATININE: 0.74 mg/dL (ref 0.44–1.00)
Calcium: 9.1 mg/dL (ref 8.9–10.3)
Chloride: 107 mmol/L (ref 98–111)
Glucose, Bld: 81 mg/dL (ref 70–99)
Potassium: 4.1 mmol/L (ref 3.5–5.1)
Sodium: 142 mmol/L (ref 135–145)
TOTAL PROTEIN: 7.1 g/dL (ref 6.5–8.1)

## 2018-10-19 ENCOUNTER — Ambulatory Visit: Payer: 59 | Admitting: Medical

## 2018-10-29 ENCOUNTER — Other Ambulatory Visit: Payer: Self-pay

## 2018-10-29 ENCOUNTER — Encounter (HOSPITAL_BASED_OUTPATIENT_CLINIC_OR_DEPARTMENT_OTHER): Payer: Self-pay | Admitting: Emergency Medicine

## 2018-10-29 ENCOUNTER — Emergency Department (HOSPITAL_BASED_OUTPATIENT_CLINIC_OR_DEPARTMENT_OTHER)
Admission: EM | Admit: 2018-10-29 | Discharge: 2018-10-29 | Disposition: A | Discharge status: Home / Self Care | Payer: 59 | Attending: Emergency Medicine | Admitting: Emergency Medicine

## 2018-10-29 DIAGNOSIS — H9203 Otalgia, bilateral: Secondary | ICD-10-CM | POA: Diagnosis not present

## 2018-10-29 DIAGNOSIS — R51 Headache: Secondary | ICD-10-CM | POA: Diagnosis not present

## 2018-10-29 DIAGNOSIS — Z7901 Long term (current) use of anticoagulants: Secondary | ICD-10-CM | POA: Diagnosis not present

## 2018-10-29 DIAGNOSIS — J111 Influenza due to unidentified influenza virus with other respiratory manifestations: Secondary | ICD-10-CM | POA: Diagnosis not present

## 2018-10-29 DIAGNOSIS — M7918 Myalgia, other site: Secondary | ICD-10-CM | POA: Insufficient documentation

## 2018-10-29 DIAGNOSIS — R05 Cough: Secondary | ICD-10-CM | POA: Diagnosis present

## 2018-10-29 DIAGNOSIS — R69 Illness, unspecified: Secondary | ICD-10-CM

## 2018-10-29 MED ORDER — OSELTAMIVIR PHOSPHATE 75 MG PO CAPS: 75.0000 mg | ORAL_CAPSULE | Freq: Two times a day (BID) | ORAL | 0 refills | Status: DC

## 2018-10-29 MED ORDER — ONDANSETRON 4 MG PO TBDP: 4.0000 mg | ORAL_TABLET | Freq: Four times a day (QID) | ORAL | 0 refills | Status: DC | PRN

## 2018-10-29 MED ORDER — BENZONATATE 100 MG PO CAPS: 100.0000 mg | ORAL_CAPSULE | Freq: Three times a day (TID) | ORAL | 0 refills | Status: DC

## 2018-10-29 MED ORDER — BENZONATATE 100 MG PO CAPS
200.0000 mg | ORAL_CAPSULE | Freq: Once | ORAL | Status: AC
  Administered 2018-10-29: 200 mg via ORAL
  Filled 2018-10-29: qty 2

## 2018-10-29 MED ORDER — SODIUM CHLORIDE 0.9 % IV BOLUS (SEPSIS)
1000.0000 mL | Freq: Once | INTRAVENOUS | Status: AC
  Administered 2018-10-29: 1000 mL via INTRAVENOUS

## 2018-10-29 MED ORDER — ACETAMINOPHEN 500 MG PO TABS
1000.0000 mg | ORAL_TABLET | Freq: Once | ORAL | Status: AC
  Administered 2018-10-29: 1000 mg via ORAL
  Filled 2018-10-29: qty 2

## 2018-10-29 MED ORDER — ONDANSETRON HCL 4 MG/2ML IJ SOLN
4.0000 mg | Freq: Once | INTRAMUSCULAR | Status: AC
  Administered 2018-10-29: 4 mg via INTRAVENOUS
  Filled 2018-10-29: qty 2

## 2018-10-29 NOTE — Discharge Instructions (Signed)
You may take Tylenol 1000 mg every 6 hours as needed for fever and pain.  Please rest and drink plenty of fluids. This is a viral illness causing your symptoms. You do not need antibiotics for a virus. You may use over-the-counter nasal saline spray and Afrin nasal saline spray as needed for nasal congestion. Please do not use Afrin for more than 3 days in a row. You may use guaifenesin and dextromethorphan as needed for cough.  You may use lozenges and Chloraseptic spray to help with sore throat.  Warm salt water gargles can also help with sore throat.  You may use over-the-counter Unisom (doxyalamine) or Benadryl to help with sleep.  Please note that some combination medicines such as DayQuil and NyQuil have multiple medications in them.  Please make sure you look at all labels to ensure that you are not taking too much of any one particular medication.  Symptoms from a virus may take 7-14 days to run its course.  We do not test for the flu from the emergency department as it takes hours for this test to come back and it would not change our management. The flu is treated like any other virus with supportive measures as listed above.  I have given you a prescription for Tamiflu.  Tamiflu has to be taken within the first 48 hours of symptoms.  Tamiflu has many side effects including nausea, vomiting and diarrhea.  We have given your prescription of Zofran for nausea and vomiting.  You may take over-the-counter Imodium as needed for diarrhea.

## 2018-10-29 NOTE — ED Notes (Signed)
Blood specimens drawn and placed in lab, in case of future orders.

## 2018-10-29 NOTE — ED Provider Notes (Signed)
TIME SEEN: 6:49 AM  CHIEF COMPLAINT: Influenza-like illness  HPI: Patient is a 28 year old female with history of DVT currently on Xarelto who presents to the emergency department with flulike symptoms that started last night.  States she has had nonproductive cough, headache, bilateral ear pain, body aches, chills, nausea and vomiting.  No diarrhea, abdominal pain.  No rash.  Did not have a flu shot this year.  Works in detention center with adults.  No recent travel.  Took Alka-Seltzer last night.  No medications this morning.  ROS: See HPI Constitutional:  fever  Eyes: no drainage  ENT:  runny nose   Cardiovascular:  no chest pain  Resp: no SOB  GI:  vomiting GU: no dysuria Integumentary: no rash  Allergy: no hives  Musculoskeletal: no leg swelling  Neurological: no slurred speech ROS otherwise negative  PAST MEDICAL HISTORY/PAST SURGICAL HISTORY:  Past Medical History:  Diagnosis Date  . Asthma    only as a child.  Marland Kitchen DVT (deep venous thrombosis) (HCC)     MEDICATIONS:  Prior to Admission medications   Medication Sig Start Date End Date Taking? Authorizing Provider  folic acid (FOLVITE) 1 MG tablet Take 1 mg by mouth daily.    [provider]  hydrOXYzine (ATARAX/VISTARIL) 10 MG tablet Take 1 tablet (10 mg total) by mouth 3 (three) times daily as needed for itching. 05/04/18   Saguier, Ramon Dredge, PA-C  rivaroxaban (XARELTO) 10 MG TABS tablet Take 1 tablet (10 mg total) by mouth daily with supper. 04/26/18   [place], Brand Males, NP    ALLERGIES:  No Known Allergies  SOCIAL HISTORY:  Social History   Tobacco Use  . Smoking status: Never Smoker  . Smokeless tobacco: Never Used  Substance Use Topics  . Alcohol use: No    FAMILY HISTORY: Family History  Problem Relation Age of Onset  . Clotting disorder Mother     EXAM: BP 130/74 (BP Location: Right Arm)   Pulse (!) 145   Temp (!) 101.6 F (38.7 C) (Oral)   Resp 20   SpO2 98%  CONSTITUTIONAL: Alert and  oriented and responds appropriately to questions. Well-appearing; well-nourished HEAD: Normocephalic EYES: Conjunctivae clear, pupils appear equal, EOMI ENT: normal nose; patient has clear nasal congestion noted in both nostrils, moist mucous membranes; No pharyngeal erythema or petechiae, no tonsillar hypertrophy or exudate, no uvular deviation, no unilateral swelling, no trismus or drooling, no muffled voice, normal phonation, no stridor, no dental caries present, no drainable dental abscess noted, no Ludwig's angina, tongue sits flat in the bottom of the mouth, no angioedema, no facial erythema or warmth, no facial swelling; no pain with movement of the neck.  TMs are clear bilaterally without erythema, purulence, bulging, perforation, effusion.  No cerumen impaction or sign of foreign body in the external auditory canal. No inflammation, erythema or drainage from the external auditory canal. No signs of mastoiditis. No pain with manipulation of the pinna bilaterally. NECK: Supple, no meningismus, no nuchal rigidity, no LAD  CARD: Regular and tachycardic; S1 and S2 appreciated; no murmurs, no clicks, no rubs, no gallops RESP: Normal chest excursion without splinting or tachypnea; breath sounds clear and equal bilaterally; no wheezes, no rhonchi, no rales, no hypoxia or respiratory distress, speaking full sentences ABD/GI: Normal bowel sounds; non-distended; soft, non-tender, no rebound, no guarding, no peritoneal signs, no hepatosplenomegaly BACK:  The back appears normal and is non-tender to palpation, there is no CVA tenderness EXT: Normal ROM in all joints; non-tender  to palpation; no edema; normal capillary refill; no cyanosis, no calf tenderness or swelling    SKIN: Normal color for age and race; warm; no rash NEURO: Moves all extremities equally PSYCH: The patient's mood and manner are appropriate. Grooming and personal hygiene are appropriate.  MEDICAL DECISION MAKING: Patient here with  flulike symptoms.  Did not have a flu shot this year.  Discussed with patient that we do not do routine flu testing from the emergency department as it would not change our management.  She verbalized understanding.  Discussed with patient that I do not feel she needs antibiotic treatment at this time as she has no signs or symptoms of a bacterial infection.  Doubt meningitis, pneumonia, bacteremia, sepsis.  Will give IV fluids, Zofran, Tylenol for symptomatic relief.    Discussed risk and benefits of Tamiflu.  Patient would like a prescription for this medication.  We will also provide with work note.  Discussed return precautions and supportive care instructions.  Discussed different over-the-counter medications that can help with symptomatic relief.  Patient verbalized understanding.   Patient will be discharged when she is feeling better, tolerating p.o. and her vital signs have improved.   At this time, I do not feel there is any life-threatening condition present. I have reviewed and discussed all results (EKG, imaging, lab, urine as appropriate) and exam findings with patient/family. I have reviewed nursing notes and appropriate previous records.  I feel the patient is safe to be discharged home without further emergent workup and can continue workup as an outpatient as needed. Discussed usual and customary return precautions. Patient/family verbalize understanding and are comfortable with this plan.  Outpatient follow-up has been provided as needed. All questions have been answered.      Holbert Caples, Layla Maw, DO 10/29/18 [phone]

## 2018-10-29 NOTE — ED Provider Notes (Signed)
I assumed care at change of shift. Feeling a little better after tylenol and IVF. Still tachycardic, but improved from arrival. Expected with febrile illness. No new complaints. Requested written prescriptions and also medication for cough. Return precautions re-iterated.    Raeford Razor, MD 10/29/18 [phone]

## 2018-10-29 NOTE — ED Triage Notes (Addendum)
Cough, chills, body aches, ear pain since waking this morning. Actively vomiting in triage.

## 2018-11-08 MED FILL — XARELTO 10 MG TABLET: 10 | 30 days supply | Qty: 30 | Fill #2

## 2018-12-28 ENCOUNTER — Ambulatory Visit: Payer: 59 | Admitting: Family

## 2018-12-28 ENCOUNTER — Other Ambulatory Visit: Payer: 59

## 2019-01-18 MED FILL — XARELTO 10 MG TABLET: 10 | 30 days supply | Qty: 30 | Fill #3

## 2019-03-20 MED FILL — XARELTO 10 MG TABLET: 10 | 30 days supply | Qty: 30 | Fill #4

## 2019-11-05 ENCOUNTER — Other Ambulatory Visit: Payer: 59

## 2020-07-16 ENCOUNTER — Ambulatory Visit: Payer: 59 | Admitting: Medical

## 2020-09-23 ENCOUNTER — Other Ambulatory Visit: Payer: Self-pay | Admitting: Family

## 2020-09-23 ENCOUNTER — Ambulatory Visit (INDEPENDENT_AMBULATORY_CARE_PROVIDER_SITE_OTHER): Payer: 59

## 2020-09-23 ENCOUNTER — Other Ambulatory Visit: Payer: Self-pay

## 2020-09-23 ENCOUNTER — Telehealth: Payer: Self-pay | Admitting: *Deleted

## 2020-09-23 DIAGNOSIS — I82492 Acute embolism and thrombosis of other specified deep vein of left lower extremity: Secondary | ICD-10-CM

## 2020-09-23 DIAGNOSIS — Z86718 Personal history of other venous thrombosis and embolism: Secondary | ICD-10-CM

## 2020-09-23 NOTE — Telephone Encounter (Signed)
I called patient and spoke to her reminding her of her appointments tomorrow with Sarah [place],NP. I also asked if she was taking Xarelto. She stated,"I haven't taken Xarelto since August 2020. [name] told me after my year mark that I could stop." She verbalized understanding.

## 2020-09-24 ENCOUNTER — Other Ambulatory Visit: Payer: Self-pay | Admitting: Family

## 2020-09-24 ENCOUNTER — Inpatient Hospital Stay (HOSPITAL_BASED_OUTPATIENT_CLINIC_OR_DEPARTMENT_OTHER): Payer: 59 | Admitting: Family

## 2020-09-24 ENCOUNTER — Inpatient Hospital Stay: Payer: 59 | Attending: Family

## 2020-09-24 ENCOUNTER — Other Ambulatory Visit: Payer: Self-pay

## 2020-09-24 ENCOUNTER — Telehealth: Payer: Self-pay | Admitting: Family

## 2020-09-24 ENCOUNTER — Encounter: Payer: Self-pay | Admitting: Family

## 2020-09-24 VITALS — BP 126/79 | HR 65 | Temp 98.1°F | Resp 18 | Ht 62.99 in | Wt 199.0 lb

## 2020-09-24 DIAGNOSIS — Z86718 Personal history of other venous thrombosis and embolism: Secondary | ICD-10-CM

## 2020-09-24 DIAGNOSIS — I82402 Acute embolism and thrombosis of unspecified deep veins of left lower extremity: Secondary | ICD-10-CM

## 2020-09-24 DIAGNOSIS — D6859 Other primary thrombophilia: Secondary | ICD-10-CM

## 2020-09-24 LAB — CBC WITH DIFFERENTIAL (CANCER CENTER ONLY)
Abs Immature Granulocytes: 0.02 10*3/uL (ref 0.00–0.07)
Basophils Absolute: 0 10*3/uL (ref 0.0–0.1)
Basophils Relative: 1 %
Eosinophils Absolute: 0.1 10*3/uL (ref 0.0–0.5)
Eosinophils Relative: 2 %
HCT: 40.1 % (ref 36.0–46.0)
Hemoglobin: 12.8 g/dL (ref 12.0–15.0)
Immature Granulocytes: 1 %
Lymphocytes Relative: 32 %
Lymphs Abs: 1.3 10*3/uL (ref 0.7–4.0)
MCH: 28.9 pg (ref 26.0–34.0)
MCHC: 31.9 g/dL (ref 30.0–36.0)
MCV: 90.5 fL (ref 80.0–100.0)
Monocytes Absolute: 0.4 10*3/uL (ref 0.1–1.0)
Monocytes Relative: 9 %
Neutro Abs: 2.3 10*3/uL (ref 1.7–7.7)
Neutrophils Relative %: 55 %
Platelet Count: 228 10*3/uL (ref 150–400)
RBC: 4.43 MIL/uL (ref 3.87–5.11)
RDW: 11.4 % — ABNORMAL LOW (ref 11.5–15.5)
WBC Count: 4.2 10*3/uL (ref 4.0–10.5)
nRBC: 0 % (ref 0.0–0.2)

## 2020-09-24 LAB — CMP (CANCER CENTER ONLY)
ALT: 14 U/L (ref 0–44)
AST: 16 U/L (ref 15–41)
Albumin: 4.2 g/dL (ref 3.5–5.0)
Alkaline Phosphatase: 52 U/L (ref 38–126)
Anion gap: 6 (ref 5–15)
BUN: 21 mg/dL — ABNORMAL HIGH (ref 6–20)
CO2: 30 mmol/L (ref 22–32)
Calcium: 9.4 mg/dL (ref 8.9–10.3)
Chloride: 105 mmol/L (ref 98–111)
Creatinine: 0.61 mg/dL (ref 0.44–1.00)
GFR, Estimated: >60 mL/min (ref >60)
Glucose, Bld: 94 mg/dL (ref 70–99)
Potassium: 3.7 mmol/L (ref 3.5–5.1)
Sodium: 141 mmol/L (ref 135–145)
Total Bilirubin: 0.3 mg/dL (ref 0.3–1.2)
Total Protein: 6.7 g/dL (ref 6.5–8.1)

## 2020-09-24 LAB — LACTATE DEHYDROGENASE: LDH: 124 U/L (ref 98–192)

## 2020-09-24 MED ORDER — RIVAROXABAN (XARELTO) VTE STARTER PACK (15 & 20 MG): ORAL_TABLET | ORAL | 0 refills | Status: DC

## 2020-09-24 MED FILL — XARELTO STARTER PACK: 15 & 20 | 30 days supply | Qty: 51 | Fill #0

## 2020-09-24 NOTE — Telephone Encounter (Signed)
Appointments scheduled calendar printed per 12/15 los 

## 2020-09-24 NOTE — Progress Notes (Signed)
Hematology and Oncology Follow Up Visit  Felicia Lawrence [id number] May 23, 1991 29 y.o. 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 We last saw her on November 2019. She noted burning in her left calf recently and was concerned about recurrent thrombus.[place] yesterday confirmed recurrent DVT involving LEFT gastrocnemius/soleal veins.  She still has the burning in her left calf but no swelling. Bilateral pedal pulses 2+.  No swelling, numbness or tingling in her extremities.  No falls or syncopal episodes to report.  She denies recent travel or injury.  She is not on birth control.  No fever, chills, n/v, cough, rash, dizziness, SOB, chest pain, palpitations, abdominal pain or changes in bowel or bladder habits.  She has maintained a good appetite and is staying well hydrated. Her weight is stable.   ECOG Performance Status: 1 - Symptomatic but completely ambulatory  Medications:  Allergies as of 09/24/2020   No Known Allergies     Medication List       Accurate as of September 24, 2020  8:54 AM. If you have any questions, ask your nurse or doctor.        benzonatate 100 MG capsule Commonly known as: TESSALON Take 1 capsule (100 mg total) by mouth every 8 (eight) hours.   folic acid 1 MG tablet Commonly known as: FOLVITE Take 1 mg by mouth daily.   hydrOXYzine 10 MG tablet Commonly known as: ATARAX/VISTARIL Take 1 tablet (10 mg total) by mouth 3 (three) times daily as needed for itching.   ondansetron 4 MG disintegrating tablet Commonly known as: Zofran ODT Take 1 tablet (4 mg total) by mouth every 6 (six) hours as needed for nausea or vomiting.   oseltamivir 75 MG capsule Commonly known as: TAMIFLU Take 1  capsule (75 mg total) by mouth every 12 (twelve) hours.   rivaroxaban 10 MG Tabs tablet Commonly known as: XARELTO Take 1 tablet (10 mg total) by mouth daily with supper.       Allergies: No Known Allergies  Past Medical History, Surgical history, Social history, and Family History were reviewed and updated.  Review of Systems: All other 10 point review of systems is negative.   Physical Exam:  vitals were not taken for this visit.   Wt Readings from Last 3 Encounters:  08/31/18 164 lb 12.8 oz (74.8 kg)  05/04/18 167 lb 6.4 oz (75.9 kg)  04/26/18 171 lb 1.9 oz (77.6 kg)    Ocular: Sclerae unicteric, pupils equal, round and reactive to light Ear-nose-throat: Oropharynx clear, dentition fair Lymphatic: No cervical or supraclavicular adenopathy Lungs no rales or rhonchi, good excursion bilaterally Heart regular rate and rhythm, no murmur appreciated Abd soft, nontender, positive bowel sounds MSK no focal spinal tenderness, no joint edema Neuro: non-focal, well-oriented, appropriate affect Breasts: Deferred   Lab Results  Component Value Date   WBC 4.2 09/24/2020   HGB 12.8 09/24/2020   HCT 40.1 09/24/2020   MCV 90.5 09/24/2020   PLT 228 09/24/2020   No results found for: FERRITIN, IRON, TIBC, UIBC, IRONPCTSAT Lab Results  Component Value Date   RBC 4.43 09/24/2020   No results found for: KPAFRELGTCHN, LAMBDASER, KAPLAMBRATIO No results found for: IGGSERUM, IGA, IGMSERUM No results found  for: Marda Stalker, SPEI   Chemistry      Component Value Date/Time   NA 141 09/24/2020 0821   K 3.7 09/24/2020 0821   CL 105 09/24/2020 0821   CO2 30 09/24/2020 0821   BUN 21 (H) 09/24/2020 0821   CREATININE 0.61 09/24/2020 0821      Component Value Date/Time   CALCIUM 9.4 09/24/2020 0821   ALKPHOS 52 09/24/2020 0821   AST 16 09/24/2020 0821   ALT 14 09/24/2020 0821   BILITOT 0.3 09/24/2020 0821       Impression  and Plan: Felicia Lawrence is a very pleasant 29 yo female with protein S deficiency and history of DVT in the right lower extremity in 2018 and now 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  We will get her back onto Xarelto and plan to see her again in a month.  With her significant history of DVT and protein S deficiency she will now need to remain on anticoagulation lifelong. We will repeat her [place] in 3 months to assess for resolution of her clot.  She is in agreement with the plan and was encouraged to contact our office with any questions or concerns.   Emeline Gins, NP 12/15/20218:54 AM

## 2020-09-26 LAB — PROTEIN S ACTIVITY: Protein S Activity: 21 % — ABNORMAL LOW (ref 63–140)

## 2020-09-26 LAB — PROTEIN S, TOTAL: Protein S Ag, Total: 56 % — ABNORMAL LOW (ref 60–150)

## 2020-10-21 ENCOUNTER — Other Ambulatory Visit: Payer: Self-pay | Admitting: *Deleted

## 2020-10-21 ENCOUNTER — Other Ambulatory Visit: Payer: Self-pay | Admitting: Family

## 2020-10-21 ENCOUNTER — Encounter: Payer: Self-pay | Admitting: Family

## 2020-10-21 DIAGNOSIS — Z86718 Personal history of other venous thrombosis and embolism: Secondary | ICD-10-CM

## 2020-10-21 DIAGNOSIS — D6859 Other primary thrombophilia: Secondary | ICD-10-CM

## 2020-10-21 DIAGNOSIS — I82401 Acute embolism and thrombosis of unspecified deep veins of right lower extremity: Secondary | ICD-10-CM

## 2020-10-21 DIAGNOSIS — I82402 Acute embolism and thrombosis of unspecified deep veins of left lower extremity: Secondary | ICD-10-CM

## 2020-10-21 MED ORDER — RIVAROXABAN 20 MG PO TABS: 20.0000 mg | ORAL_TABLET | Freq: Every day | ORAL | 4 refills | Status: DC

## 2020-10-23 ENCOUNTER — Encounter: Payer: Self-pay | Admitting: Family

## 2020-10-23 ENCOUNTER — Inpatient Hospital Stay: Payer: 59 | Attending: Family

## 2020-10-23 ENCOUNTER — Other Ambulatory Visit: Payer: Self-pay

## 2020-10-23 ENCOUNTER — Inpatient Hospital Stay (HOSPITAL_BASED_OUTPATIENT_CLINIC_OR_DEPARTMENT_OTHER): Payer: 59 | Admitting: Family

## 2020-10-23 ENCOUNTER — Telehealth: Payer: Self-pay

## 2020-10-23 VITALS — BP 109/68 | HR 87 | Resp 16 | Ht 62.9 in | Wt 198.8 lb

## 2020-10-23 DIAGNOSIS — Z86718 Personal history of other venous thrombosis and embolism: Secondary | ICD-10-CM | POA: Diagnosis present

## 2020-10-23 DIAGNOSIS — D500 Iron deficiency anemia secondary to blood loss (chronic): Secondary | ICD-10-CM

## 2020-10-23 DIAGNOSIS — D6859 Other primary thrombophilia: Secondary | ICD-10-CM | POA: Diagnosis not present

## 2020-10-23 DIAGNOSIS — I82402 Acute embolism and thrombosis of unspecified deep veins of left lower extremity: Secondary | ICD-10-CM

## 2020-10-23 DIAGNOSIS — Z7901 Long term (current) use of anticoagulants: Secondary | ICD-10-CM | POA: Insufficient documentation

## 2020-10-23 LAB — CBC WITH DIFFERENTIAL (CANCER CENTER ONLY)
Abs Immature Granulocytes: 0.01 10*3/uL (ref 0.00–0.07)
Basophils Absolute: 0 10*3/uL (ref 0.0–0.1)
Basophils Relative: 1 %
Eosinophils Absolute: 0.1 10*3/uL (ref 0.0–0.5)
Eosinophils Relative: 2 %
HCT: 37.8 % (ref 36.0–46.0)
Hemoglobin: 12.4 g/dL (ref 12.0–15.0)
Immature Granulocytes: 0 %
Lymphocytes Relative: 32 %
Lymphs Abs: 1.5 10*3/uL (ref 0.7–4.0)
MCH: 29.4 pg (ref 26.0–34.0)
MCHC: 32.8 g/dL (ref 30.0–36.0)
MCV: 89.6 fL (ref 80.0–100.0)
Monocytes Absolute: 0.4 10*3/uL (ref 0.1–1.0)
Monocytes Relative: 10 %
Neutro Abs: 2.5 10*3/uL (ref 1.7–7.7)
Neutrophils Relative %: 55 %
Platelet Count: 241 10*3/uL (ref 150–400)
RBC: 4.22 MIL/uL (ref 3.87–5.11)
RDW: 11.3 % — ABNORMAL LOW (ref 11.5–15.5)
WBC Count: 4.5 10*3/uL (ref 4.0–10.5)
nRBC: 0 % (ref 0.0–0.2)

## 2020-10-23 LAB — CMP (CANCER CENTER ONLY)
ALT: 17 U/L (ref 0–44)
AST: 18 U/L (ref 15–41)
Albumin: 4.2 g/dL (ref 3.5–5.0)
Alkaline Phosphatase: 51 U/L (ref 38–126)
Anion gap: 7 (ref 5–15)
BUN: 18 mg/dL (ref 6–20)
CO2: 31 mmol/L (ref 22–32)
Calcium: 9.6 mg/dL (ref 8.9–10.3)
Chloride: 103 mmol/L (ref 98–111)
Creatinine: 0.66 mg/dL (ref 0.44–1.00)
GFR, Estimated: >60 mL/min (ref >60)
Glucose, Bld: 90 mg/dL (ref 70–99)
Potassium: 3.8 mmol/L (ref 3.5–5.1)
Sodium: 141 mmol/L (ref 135–145)
Total Bilirubin: 0.5 mg/dL (ref 0.3–1.2)
Total Protein: 6.6 g/dL (ref 6.5–8.1)

## 2020-10-23 LAB — LACTATE DEHYDROGENASE: LDH: 132 U/L (ref 98–192)

## 2020-10-23 MED FILL — XARELTO 20 MG TABLET: 20 | 30 days supply | Qty: 30 | Fill #0

## 2020-10-23 NOTE — Telephone Encounter (Signed)
Pt aware of her f/u appt and that her u/s will be sch upon approval for the same day  aom

## 2020-10-23 NOTE — Progress Notes (Signed)
Hematology and Oncology Follow Up Visit  Malisha Mabey [id number] [birth year] 29 y.o. 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 Wee is here today for follow-up. She is having occasional numbness and tingling in her left leg and will sometimes feel a "catch" in her left knee.. She has had some mild swelling off and on and is wearing compression stockings for added support. No redness or edema noted on exam. Pedal pulses are 2-3+.  She notes that since starting Xarelto daily as prescribed her cycle was heavier this last month. No other blood loss noted. No bruising or petechiae.  No fever, chills, n/v, cough, rash, dizziness, SOB, chest pain, palpitations, abdominal pain or changes in bowel or bladder habits.  No falls or syncope to report.  She has maintained a good appetite and is staying well hydrated. Her weight is stable at 198 lbs.   ECOG Performance Status: 1 - Symptomatic but completely ambulatory  Medications:  Allergies as of 10/23/2020   No Known Allergies     Medication List       Accurate as of October 23, 2020  9:33 AM. If you have any questions, ask your nurse or doctor.        folic acid 1 MG tablet Commonly known as: FOLVITE Take 1 mg by mouth daily.   rivaroxaban 20 MG Tabs tablet Commonly known as: XARELTO Take 1 tablet (20 mg total) by mouth daily with supper.       Allergies: No Known Allergies  Past Medical History, Surgical history, Social history, and Family History were reviewed and updated.  Review of Systems: All other 10 point review of systems is negative.   Physical Exam:  height is 5' 2.9" (1.598 m) and weight is 198 lb 12.8 oz (90.2 kg). Her blood pressure is 109/68 and her pulse is 87. Her respiration is 16 and oxygen saturation is  100%.   Wt Readings from Last 3 Encounters:  10/23/20 198 lb 12.8 oz (90.2 kg)  09/24/20 199 lb (90.3 kg)  08/31/18 164 lb 12.8 oz (74.8 kg)    Ocular: Sclerae unicteric, pupils equal, round and reactive to light Ear-nose-throat: Oropharynx clear, dentition fair Lymphatic: No cervical or supraclavicular adenopathy Lungs no rales or rhonchi, good excursion bilaterally Heart regular rate and rhythm, no murmur appreciated Abd soft, nontender, positive bowel sounds MSK no focal spinal tenderness, no joint edema Neuro: non-focal, well-oriented, appropriate affect Breasts: Deferred   Lab Results  Component Value Date   WBC 4.5 10/23/2020   HGB 12.4 10/23/2020   HCT 37.8 10/23/2020   MCV 89.6 10/23/2020   PLT 241 10/23/2020   No results found for: FERRITIN, IRON, TIBC, UIBC, IRONPCTSAT Lab Results  Component Value Date   RBC 4.22 10/23/2020   No results found for: KPAFRELGTCHN, LAMBDASER, KAPLAMBRATIO No results found for: IGGSERUM, IGA, IGMSERUM No results found for: Dorene Ar, A1GS, A2GS, Colin Benton, MSPIKE, SPEI   Chemistry      Component Value Date/Time   NA 141 10/23/2020 0825   K 3.8 10/23/2020 0825   CL 103 10/23/2020 0825   CO2 31 10/23/2020 0825   BUN 18 10/23/2020 0825   CREATININE 0.66 10/23/2020 0825      Component Value Date/Time   CALCIUM 9.6 10/23/2020 0825   ALKPHOS 51 10/23/2020  0825   AST 18 10/23/2020 0825   ALT 17 10/23/2020 0825   BILITOT 0.5 10/23/2020 0825       Impression and Plan: Ms. Durley is a very pleasant 30 yo female with protein S deficiency and history of DVT in the right lower extremity in 2018 and now 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  She is doing well on Xarelto and is now on 20 mg PO daily.  We will plan to see her back in 8 weeks and repeat her US of the left leg that same day.  She is in agreement with the plan and will contact our office with any questions or concerns.   Emeline Gins, NP 1/13/20229:33 AM

## 2020-10-25 LAB — PROTEIN S ACTIVITY: Protein S Activity: 18 % — ABNORMAL LOW (ref 63–140)

## 2020-10-25 LAB — PROTEIN S, TOTAL: Protein S Ag, Total: 50 % — ABNORMAL LOW (ref 60–150)

## 2020-12-18 ENCOUNTER — Ambulatory Visit: Payer: 59 | Admitting: Medical

## 2020-12-18 ENCOUNTER — Other Ambulatory Visit: Payer: Self-pay

## 2020-12-18 ENCOUNTER — Encounter: Payer: Self-pay | Admitting: Medical

## 2020-12-18 VITALS — BP 111/50 | HR 84 | Resp 20 | Ht 62.0 in | Wt 202.8 lb

## 2020-12-18 DIAGNOSIS — M25562 Pain in left knee: Secondary | ICD-10-CM

## 2020-12-18 DIAGNOSIS — E669 Obesity, unspecified: Secondary | ICD-10-CM

## 2020-12-18 DIAGNOSIS — I82502 Chronic embolism and thrombosis of unspecified deep veins of left lower extremity: Secondary | ICD-10-CM

## 2020-12-18 MED FILL — XARELTO 20 MG TABLET: 20 | 30 days supply | Qty: 30 | Fill #1

## 2020-12-18 NOTE — Progress Notes (Signed)
Subjective:    Patient ID: Felicia Lawrence, female    DOB: 04/25/1991, 30 y.o.   MRN: [id number]  HPI  Pt in for follow up.  Pt updated me on left side dvt found in December. Pt being followed by hematologist. Pt is on xarelto. Appointment with hematologist on Monday.  Pt states she has hx of recent left knee pain. Hurts to flex and extend. Pt describes possible crepitus sensation. Sometimes feel like knee locks. No fall or injury. Pt has gotten a knee brace at times and seems to help some. Does use tylenol on occasion. When standing and bending over to tie shoe left side hurts the most.  Also pt has concerns about her weight. bmi 37.08. Pt has been cutting out soda, carbs and eating air fried or grilled. Avoid beef. No pork. Pt skips breakfast. Pt exercises walking 3 times a week.    Review of Systems  Constitutional: Negative for chills and fatigue.  HENT: Negative for congestion, ear discharge, ear pain and facial swelling.   Respiratory: Negative for cough, chest tightness, shortness of breath and wheezing.   Cardiovascular: Negative for chest pain and palpitations.  Gastrointestinal: Negative for abdominal pain, blood in stool, diarrhea and nausea.  Genitourinary: Negative for dysuria, enuresis and flank pain.  Musculoskeletal: Negative for joint swelling.  Neurological: Negative for facial asymmetry, speech difficulty, weakness and numbness.  Hematological: Negative for adenopathy. Does not bruise/bleed easily.  Psychiatric/Behavioral: Negative for behavioral problems and confusion.    Past Medical History:  Diagnosis Date  . Asthma    only as a child.  Marland Kitchen DVT (deep venous thrombosis) (HCC)      Social History   Socioeconomic History  . Marital status: Single    Spouse name: Not on file  . Number of children: Not on file  . Years of education: Not on file  . Highest education level: Not on file  Occupational History  . Not on file  Tobacco Use  . Smoking  status: Never Smoker  . Smokeless tobacco: Never Used  Vaping Use  . Vaping Use: Never used  Substance and Sexual Activity  . Alcohol use: No  . Drug use: No  . Sexual activity: Yes    Birth control/protection: None  Other Topics Concern  . Not on file  Social History Narrative  . Not on file   Social Determinants of Health   Financial Resource Strain: Not on file  Food Insecurity: Not on file  Transportation Needs: Not on file  Physical Activity: Not on file  Stress: Not on file  Social Connections: Not on file  Intimate Partner Violence: Not on file    No past surgical history on file.  Family History  Problem Relation Age of Onset  . Clotting disorder Mother     No Known Allergies  Current Outpatient Medications on File Prior to Visit  Medication Sig Dispense Refill  . folic acid (FOLVITE) 1 MG tablet Take 1 mg by mouth daily.    . rivaroxaban (XARELTO) 20 MG TABS tablet Take 1 tablet (20 mg total) by mouth daily with supper. 30 tablet 4   No current facility-administered medications on file prior to visit.    BP (!) 111/50   Pulse 84   Resp 20   Ht 5\' 2"  (1.575 m)   Wt 202 lb 12.8 oz (92 kg)   LMP 12/05/2020   SpO2 98%   BMI 37.09 kg/m       Objective:  Physical Exam  . General Mental Status- Alert. General Appearance- Not in acute distress.   Skin General: Color- Normal Color. Moisture- Normal Moisture.  Neck Carotid Arteries- Normal color. Moisture- Normal Moisture. No carotid bruits. No JVD.  Chest and Lung Exam Auscultation: Breath Sounds:-Normal.  Cardiovascular Auscultation:Rythm- Regular. Murmurs & Other Heart Sounds:Auscultation of the heart reveals- No Murmurs.  Abdomen Inspection:-Inspeection Normal. Palpation/Percussion:Note:No mass. Palpation and Percussion of the abdomen reveal- Non Tender, Non Distended + BS, no rebound or guarding.   Neurologic Cranial Nerve exam:- CN III-XII intact(No nystagmus), symmetric  smile. Strength:- 5/5 equal and symmetric strength both upper and lower extremities.  Left knee - good rom. No crepitus. No instability.     Assessment & Plan:  For left knee pain approximately  6 weeks, I do think best way to approach work-up this is to refer you to sports medicine.  You might have some internal derangement/injury to soft tissue/meniscus.  Sometimes sports medicine will utilize an office ultrasound.  Will defer to them whether or not you need x-ray.  Can use Tylenol for pain and knee brace as well.  For DVT continue on Xarelto and follow-up with hematologist.  For desired weight loss/obese, I do recommend that you continue to exercise 3-5 times weekly.  Would recommend diet plan such as weight watchers.  I discussed potential benefit of getting thyroid studies.  Patient declined presently.  Follow-up in 1 month or as needed.

## 2020-12-18 NOTE — Patient Instructions (Signed)
For left knee pain approximately  6 weeks, I do think best way to approach work-up this is to refer you to sports medicine.  You might have some internal derangement/injury to soft tissue/meniscus.  Sometimes sports medicine will utilize an office ultrasound.  Will defer to them whether or not you need x-ray.  Can use Tylenol for pain and knee brace as well.  For DVT continue on Xarelto and follow-up with hematologist.  For desired weight loss/obese, I do recommend that you continue to exercise 3-5 times weekly.  Would recommend diet plan such as weight watchers.  I discussed potential benefit of getting thyroid studies.  Patient declined presently.  Follow-up in 1 month or as needed.

## 2020-12-22 ENCOUNTER — Telehealth: Payer: Self-pay

## 2020-12-22 ENCOUNTER — Other Ambulatory Visit: Payer: Self-pay

## 2020-12-22 ENCOUNTER — Inpatient Hospital Stay (HOSPITAL_BASED_OUTPATIENT_CLINIC_OR_DEPARTMENT_OTHER): Payer: 59 | Admitting: Family

## 2020-12-22 ENCOUNTER — Inpatient Hospital Stay: Payer: 59 | Attending: Family

## 2020-12-22 ENCOUNTER — Ambulatory Visit (HOSPITAL_BASED_OUTPATIENT_CLINIC_OR_DEPARTMENT_OTHER)
Admission: RE | Admit: 2020-12-22 | Discharge: 2020-12-22 | Disposition: A | Discharge status: Home / Self Care | Payer: 59 | Source: Ambulatory Visit | Attending: Family | Admitting: Family

## 2020-12-22 ENCOUNTER — Encounter: Payer: Self-pay | Admitting: Family

## 2020-12-22 VITALS — BP 104/76 | HR 71 | Temp 98.7°F | Resp 18 | Ht 62.0 in | Wt 203.0 lb

## 2020-12-22 DIAGNOSIS — D6859 Other primary thrombophilia: Secondary | ICD-10-CM | POA: Insufficient documentation

## 2020-12-22 DIAGNOSIS — Z7901 Long term (current) use of anticoagulants: Secondary | ICD-10-CM | POA: Diagnosis not present

## 2020-12-22 DIAGNOSIS — Z86718 Personal history of other venous thrombosis and embolism: Secondary | ICD-10-CM | POA: Diagnosis present

## 2020-12-22 DIAGNOSIS — I82402 Acute embolism and thrombosis of unspecified deep veins of left lower extremity: Secondary | ICD-10-CM

## 2020-12-22 DIAGNOSIS — D500 Iron deficiency anemia secondary to blood loss (chronic): Secondary | ICD-10-CM

## 2020-12-22 LAB — CBC WITH DIFFERENTIAL (CANCER CENTER ONLY)
Abs Immature Granulocytes: 0.01 10*3/uL (ref 0.00–0.07)
Basophils Absolute: 0.1 10*3/uL (ref 0.0–0.1)
Basophils Relative: 1 %
Eosinophils Absolute: 0.1 10*3/uL (ref 0.0–0.5)
Eosinophils Relative: 2 %
HCT: 37.5 % (ref 36.0–46.0)
Hemoglobin: 12.2 g/dL (ref 12.0–15.0)
Immature Granulocytes: 0 %
Lymphocytes Relative: 31 %
Lymphs Abs: 1.7 10*3/uL (ref 0.7–4.0)
MCH: 29.1 pg (ref 26.0–34.0)
MCHC: 32.5 g/dL (ref 30.0–36.0)
MCV: 89.5 fL (ref 80.0–100.0)
Monocytes Absolute: 0.5 10*3/uL (ref 0.1–1.0)
Monocytes Relative: 9 %
Neutro Abs: 3.2 10*3/uL (ref 1.7–7.7)
Neutrophils Relative %: 57 %
Platelet Count: 243 10*3/uL (ref 150–400)
RBC: 4.19 MIL/uL (ref 3.87–5.11)
RDW: 11.7 % (ref 11.5–15.5)
WBC Count: 5.5 10*3/uL (ref 4.0–10.5)
nRBC: 0 % (ref 0.0–0.2)

## 2020-12-22 LAB — CMP (CANCER CENTER ONLY)
ALT: 18 U/L (ref 0–44)
AST: 18 U/L (ref 15–41)
Albumin: 4.3 g/dL (ref 3.5–5.0)
Alkaline Phosphatase: 52 U/L (ref 38–126)
Anion gap: 7 (ref 5–15)
BUN: 14 mg/dL (ref 6–20)
CO2: 31 mmol/L (ref 22–32)
Calcium: 9.3 mg/dL (ref 8.9–10.3)
Chloride: 103 mmol/L (ref 98–111)
Creatinine: 0.62 mg/dL (ref 0.44–1.00)
GFR, Estimated: >60 mL/min (ref >60)
Glucose, Bld: 89 mg/dL (ref 70–99)
Potassium: 3.6 mmol/L (ref 3.5–5.1)
Sodium: 141 mmol/L (ref 135–145)
Total Bilirubin: 0.4 mg/dL (ref 0.3–1.2)
Total Protein: 6.7 g/dL (ref 6.5–8.1)

## 2020-12-22 LAB — LACTATE DEHYDROGENASE: LDH: 145 U/L (ref 98–192)

## 2020-12-22 NOTE — Telephone Encounter (Signed)
Called and left a vm with f/u appts per 12/22/20 los   [employer]

## 2020-12-22 NOTE — Progress Notes (Signed)
Hematology and Oncology Follow Up Visit  Felicia Lawrence [id number] 11-17-1990 29 y.o. 12/22/2020   Principle Diagnosis:  RecurrentDVT of the left gastrocnemius vein- [place] 09/23/2020 History of DVT of the right peroneal vein  Protein S deficiency  Current Therapy: Xarelto 20 mg PO daily   Interim History:  Felicia Lawrence is here today for follow-up. She is doing well and has no complaints at this time.  Her [place] today showed resolution of left gastrocnemius/soleal vein thrombus  Cycle is heavy at times for the first few days. No other blood loss noted.  No bruising or petechiae.  No fever, chills, n/v, cough, rash, dizziness, SOB, chest pain, palpitations, abdominal pain or changes in bowel or bladder habits.  No swelling, tenderness, numbness or tingling in her extremities at this time.  She is wearing compression stockings while at work for added support.  Pedal pulses are 3+.  She has maintained a good appetite and is staying well hydrated. Her weight is stable at 203 lbs.  She has been working out regularly for exercise.   ECOG Performance Status: 0 - Asymptomatic  Medications:  Allergies as of 12/22/2020   No Known Allergies     Medication List       Accurate as of December 22, 2020  3:12 PM. If you have any questions, ask your nurse or doctor.        folic acid 1 MG tablet Commonly known as: FOLVITE Take 1 mg by mouth daily.   rivaroxaban 20 MG Tabs tablet Commonly known as: XARELTO Take 1 tablet (20 mg total) by mouth daily with supper.       Allergies: No Known Allergies  Past Medical History, Surgical history, Social history, and Family History were reviewed and updated.  Review of Systems: All other 10 point review of systems is negative.   Physical Exam:  vitals were not taken for this visit.   Wt Readings from Last 3 Encounters:  12/18/20 202 lb 12.8 oz (92 kg)  10/23/20 198 lb 12.8 oz (90.2 kg)  09/24/20 199 lb (90.3 kg)     Ocular: Sclerae unicteric, pupils equal, round and reactive to light Ear-nose-throat: Oropharynx clear, dentition fair Lymphatic: No cervical or supraclavicular adenopathy Lungs no rales or rhonchi, good excursion bilaterally Heart regular rate and rhythm, no murmur appreciated Abd soft, nontender, positive bowel sounds MSK no focal spinal tenderness, no joint edema Neuro: non-focal, well-oriented, appropriate affect Breasts: Deferred   Lab Results  Component Value Date   WBC 5.5 12/22/2020   HGB 12.2 12/22/2020   HCT 37.5 12/22/2020   MCV 89.5 12/22/2020   PLT 243 12/22/2020   No results found for: FERRITIN, IRON, TIBC, UIBC, IRONPCTSAT Lab Results  Component Value Date   RBC 4.19 12/22/2020   No results found for: KPAFRELGTCHN, LAMBDASER, KAPLAMBRATIO No results found for: IGGSERUM, IGA, IGMSERUM No results found for: Dorene Ar, A1GS, A2GS, Colin Benton, MSPIKE, SPEI   Chemistry      Component Value Date/Time   NA 141 10/23/2020 0825   K 3.8 10/23/2020 0825   CL 103 10/23/2020 0825   CO2 31 10/23/2020 0825   BUN 18 10/23/2020 0825   CREATININE 0.66 10/23/2020 0825      Component Value Date/Time   CALCIUM 9.6 10/23/2020 0825   ALKPHOS 51 10/23/2020 0825   AST 18 10/23/2020 0825   ALT 17 10/23/2020 0825   BILITOT 0.5 10/23/2020 0825       Impression and Plan: Felicia Lawrence is a  very pleasant 30 yo female with protein S deficiency and history ofDVT in the right lower extremity in 2018and now recurrent DVT of the leftgastrocnemius/soleal veins.  [place] today showed resolution of the left gastrocnemius/soleal thrombus.  She will continue her same regimen with Xarelto 20 mg PO BID for one full year and then we will switch her to maintenance dosing.  We will plan to see her again in another 4 months.  She can contact our office with any questions or concerns.   Emeline Gins, NP 3/14/20223:12 PM

## 2020-12-23 LAB — IRON AND TIBC
Iron: 102 ug/dL (ref 41–142)
Saturation Ratios: 25 % (ref 21–57)
TIBC: 413 ug/dL (ref 236–444)
UIBC: 311 ug/dL (ref 120–384)

## 2020-12-23 LAB — PROTEIN S, TOTAL: Protein S Ag, Total: 55 % — ABNORMAL LOW (ref 60–150)

## 2020-12-23 LAB — FERRITIN: Ferritin: 20 ng/mL (ref 11–307)

## 2020-12-23 LAB — PROTEIN S ACTIVITY: Protein S Activity: 23 % — ABNORMAL LOW (ref 63–140)

## 2021-02-06 ENCOUNTER — Other Ambulatory Visit: Payer: Self-pay

## 2021-02-06 ENCOUNTER — Ambulatory Visit
Admission: RE | Admit: 2021-02-06 | Discharge: 2021-02-06 | Disposition: A | Discharge status: Home / Self Care | Payer: 59 | Source: Ambulatory Visit | Attending: Emergency Medicine | Admitting: Emergency Medicine

## 2021-02-06 VITALS — BP 124/86 | HR 84 | Temp 98.3°F | Resp 13

## 2021-02-06 DIAGNOSIS — N898 Other specified noninflammatory disorders of vagina: Secondary | ICD-10-CM | POA: Insufficient documentation

## 2021-02-06 LAB — POCT URINE PREGNANCY: Preg Test, Ur: NEGATIVE

## 2021-02-06 NOTE — Discharge Instructions (Signed)
We are testing you for Gonorrhea, Chlamydia, Trichomonas, Yeast and Bacterial Vaginosis. We will call you if anything is positive and let you know if you require any further treatment. Please inform partners of any positive results.   Please return if symptoms not improving with treatment, development of fever, nausea, vomiting, abdominal pain.  

## 2021-02-06 NOTE — ED Triage Notes (Signed)
Patient c/o increased vaginal discharge x 2 days.   Patient denies any foul odor, ABD, dysuria, hematuria, change in color, or consistency.    Patient denies itchiness.   Patient hasn't used any medications for symptoms.

## 2021-02-07 NOTE — ED Provider Notes (Signed)
[facility]    CSN: [id number] Arrival date & time: 02/06/21  1749      History   Chief Complaint Chief Complaint  Patient presents with  . Vaginal Discharge    HPI Felicia Lawrence is a 30 y.o. female history of asthma presenting today for evaluation of vaginal discharge.  Reports over the past 2 days has noticed increased discharge.  Denies any odor, itching, irritation abdominal pain nausea or vomiting.  Denies any urinary symptoms.  Denies any specific concerns regarding STDs.  HPI  Past Medical History:  Diagnosis Date  . Asthma    only as a child.  Marland Kitchen DVT (deep venous thrombosis) (HCC)     There are no problems to display for this patient.   History reviewed. No pertinent surgical history.  OB History   No obstetric history on file.      Home Medications    Prior to Admission medications   Medication Sig Start Date End Date Taking? Authorizing Provider  folic acid (FOLVITE) 1 MG tablet Take 1 mg by mouth daily.   Yes [provider]  rivaroxaban (XARELTO) 20 MG TABS tablet TAKE 1 TABLET BY MOUTH ONCE DAILY WITH SUPPER 10/21/20 10/21/21 Yes [place], Brand Males, NP    Family History Family History  Problem Relation Age of Onset  . Clotting disorder Mother     Social History Social History   Tobacco Use  . Smoking status: Never Smoker  . Smokeless tobacco: Never Used  Vaping Use  . Vaping Use: Never used  Substance Use Topics  . Alcohol use: No  . Drug use: No     Allergies   Patient has no known allergies.   Review of Systems Review of Systems  Constitutional: Negative for fever.  Respiratory: Negative for shortness of breath.   Cardiovascular: Negative for chest pain.  Gastrointestinal: Negative for abdominal pain, diarrhea, nausea and vomiting.  Genitourinary: Positive for vaginal discharge. Negative for dysuria, flank pain, genital sores, hematuria, menstrual problem, vaginal bleeding and vaginal pain.   Musculoskeletal: Negative for back pain.  Skin: Negative for rash.  Neurological: Negative for dizziness, light-headedness and headaches.     Physical Exam Triage Vital Signs ED Triage Vitals  Enc Vitals Group     BP 02/06/21 1832 124/86     Pulse Rate 02/06/21 1832 84     Resp 02/06/21 1832 13     Temp 02/06/21 1832 98.3 F (36.8 C)     Temp Source 02/06/21 1832 Oral     SpO2 02/06/21 1832 98 %     Weight --      Height --      Head Circumference --      Peak Flow --      Pain Score 02/06/21 1830 0     Pain Loc --      Pain Edu? --      Excl. in GC? --    No data found.  Updated Vital Signs BP 124/86 (BP Location: Left Arm)   Pulse 84   Temp 98.3 F (36.8 C) (Oral)   Resp 13   LMP 01/26/2021   SpO2 98%   Visual Acuity Right Eye Distance:   Left Eye Distance:   Bilateral Distance:    Right Eye Near:   Left Eye Near:    Bilateral Near:     Physical Exam Vitals and nursing note reviewed.  Constitutional:      Appearance: She is well-developed.  Comments: No acute distress  HENT:     Head: Normocephalic and atraumatic.     Nose: Nose normal.  Eyes:     Conjunctiva/sclera: Conjunctivae normal.  Cardiovascular:     Rate and Rhythm: Normal rate.  Pulmonary:     Effort: Pulmonary effort is normal. No respiratory distress.  Abdominal:     General: There is no distension.  Musculoskeletal:        General: Normal range of motion.     Cervical back: Neck supple.  Skin:    General: Skin is warm and dry.  Neurological:     Mental Status: She is alert and oriented to person, place, and time.      UC Treatments / Results  Labs (all labs ordered are listed, but only abnormal results are displayed) Labs Reviewed  POCT URINE PREGNANCY  CERVICOVAGINAL ANCILLARY ONLY    EKG   Radiology No results found.  Procedures Procedures (including critical care time)  Medications Ordered in UC Medications - No data to display  Initial Impression /  Assessment and Plan / UC Course  I have reviewed the triage vital signs and the nursing notes.  Pertinent labs & imaging results that were available during my care of the patient were reviewed by me and considered in my medical decision making (see chart for details).     Vaginal swab pending for further evaluation of discharge.  Discussed do not have microscopic available at this clinic to immediately detect yeast/BV.  Will call with results and provide treatment if needed.  Discussed strict return precautions. Patient verbalized understanding and is agreeable with plan.  Final Clinical Impressions(s) / UC Diagnoses   Final diagnoses:  Vaginal discharge     Discharge Instructions     We are testing you for Gonorrhea, Chlamydia, Trichomonas, Yeast and Bacterial Vaginosis. We will call you if anything is positive and let you know if you require any further treatment. Please inform partners of any positive results.   Please return if symptoms not improving with treatment, development of fever, nausea, vomiting, abdominal pain.    ED Prescriptions    None     PDMP not reviewed this encounter.   Lew Dawes, [place] 02/07/21 [phone]

## 2021-02-09 LAB — CERVICOVAGINAL ANCILLARY ONLY
Bacterial Vaginitis (gardnerella): NEGATIVE
Candida Glabrata: NEGATIVE
Candida Vaginitis: POSITIVE — AB
Chlamydia: NEGATIVE
Comment: NEGATIVE
Comment: NEGATIVE
Comment: NEGATIVE
Comment: NEGATIVE
Comment: NEGATIVE
Comment: NORMAL
Neisseria Gonorrhea: NEGATIVE
Trichomonas: NEGATIVE

## 2021-02-10 ENCOUNTER — Telehealth (HOSPITAL_COMMUNITY): Payer: Self-pay | Admitting: Emergency Medicine

## 2021-02-10 MED ORDER — FLUCONAZOLE 150 MG PO TABS: 150.0000 mg | ORAL_TABLET | Freq: Once | ORAL | 0 refills | Status: AC

## 2021-02-26 ENCOUNTER — Other Ambulatory Visit (HOSPITAL_BASED_OUTPATIENT_CLINIC_OR_DEPARTMENT_OTHER): Payer: Self-pay

## 2021-02-26 MED FILL — Rivaroxaban Tab 20 MG: ORAL | 30 days supply | Qty: 30 | Fill #0 | Status: AC

## 2021-03-06 ENCOUNTER — Ambulatory Visit: Payer: Self-pay

## 2021-03-07 ENCOUNTER — Other Ambulatory Visit: Payer: Self-pay

## 2021-03-07 ENCOUNTER — Ambulatory Visit
Admission: RE | Admit: 2021-03-07 | Discharge: 2021-03-07 | Disposition: A | Discharge status: Home / Self Care | Payer: 59 | Source: Ambulatory Visit | Attending: Emergency Medicine | Admitting: Emergency Medicine

## 2021-03-07 VITALS — BP 117/72 | HR 85 | Temp 98.3°F | Resp 18

## 2021-03-07 DIAGNOSIS — N898 Other specified noninflammatory disorders of vagina: Secondary | ICD-10-CM | POA: Diagnosis not present

## 2021-03-07 MED ORDER — FLUCONAZOLE 150 MG PO TABS: 150.0000 mg | ORAL_TABLET | Freq: Once | ORAL | 0 refills | Status: AC

## 2021-03-07 NOTE — ED Triage Notes (Signed)
Pt sts vaginal discharge; pt sts last time was yeast infection and worried could be the same

## 2021-03-07 NOTE — Discharge Instructions (Addendum)
Diflucan for yeast  We are testing you for Gonorrhea, Chlamydia, Trichomonas, Yeast and Bacterial Vaginosis. We will call you if anything is positive and let you know if you require any further treatment. Please inform partners of any positive results.   Please return if symptoms not improving with treatment, development of fever, nausea, vomiting, abdominal pain.

## 2021-03-07 NOTE — ED Provider Notes (Signed)
[facility]    CSN: [id number] Arrival date & time: 03/07/21  0844      History   Chief Complaint Chief Complaint  Patient presents with  . Appointment    0900  . Vaginal Discharge    HPI Felicia Lawrence is a 30 y.o. female presenting today for evaluation of vaginal discharge.  Reports white milky discharge over the past couple of days.  Reports similar symptoms with yeast in the past.  She denies any associated itching or irritation.  Denies abdominal pain, pelvic pain nausea or vomiting.  Last medical cycle ended on 5/16.  HPI  Past Medical History:  Diagnosis Date  . Asthma    only as a child.  Marland Kitchen DVT (deep venous thrombosis) (HCC)     There are no problems to display for this patient.   History reviewed. No pertinent surgical history.  OB History   No obstetric history on file.      Home Medications    Prior to Admission medications   Medication Sig Start Date End Date Taking? Authorizing Provider  fluconazole (DIFLUCAN) 150 MG tablet Take 1 tablet (150 mg total) by mouth once for 1 dose. 03/07/21 03/07/21 Yes Arleatha Philipps C, PA-C  folic acid (FOLVITE) 1 MG tablet Take 1 mg by mouth daily.    [provider]  rivaroxaban (XARELTO) 20 MG TABS tablet TAKE 1 TABLET BY MOUTH ONCE DAILY WITH SUPPER 10/21/20 10/21/21  [place], Brand Males, NP    Family History Family History  Problem Relation Age of Onset  . Clotting disorder Mother     Social History Social History   Tobacco Use  . Smoking status: Never Smoker  . Smokeless tobacco: Never Used  Vaping Use  . Vaping Use: Never used  Substance Use Topics  . Alcohol use: No  . Drug use: No     Allergies   Patient has no known allergies.   Review of Systems Review of Systems  Constitutional: Negative for fever.  Respiratory: Negative for shortness of breath.   Cardiovascular: Negative for chest pain.  Gastrointestinal: Negative for abdominal pain, diarrhea, nausea and  vomiting.  Genitourinary: Positive for vaginal discharge. Negative for dysuria, flank pain, genital sores, hematuria, menstrual problem, vaginal bleeding and vaginal pain.  Musculoskeletal: Negative for back pain.  Skin: Negative for rash.  Neurological: Negative for dizziness, light-headedness and headaches.     Physical Exam Triage Vital Signs ED Triage Vitals  Enc Vitals Group     BP 03/07/21 0908 117/72     Pulse Rate 03/07/21 0908 85     Resp 03/07/21 0908 18     Temp 03/07/21 0908 98.3 F (36.8 C)     Temp Source 03/07/21 0908 Oral     SpO2 03/07/21 0908 96 %     Weight --      Height --      Head Circumference --      Peak Flow --      Pain Score 03/07/21 0909 0     Pain Loc --      Pain Edu? --      Excl. in GC? --    No data found.  Updated Vital Signs BP 117/72 (BP Location: Left Arm)   Pulse 85   Temp 98.3 F (36.8 C) (Oral)   Resp 18   SpO2 96%   Visual Acuity Right Eye Distance:   Left Eye Distance:   Bilateral Distance:    Right Eye Near:  Left Eye Near:    Bilateral Near:     Physical Exam Vitals and nursing note reviewed.  Constitutional:      Appearance: She is well-developed.     Comments: No acute distress  HENT:     Head: Normocephalic and atraumatic.     Nose: Nose normal.  Eyes:     Conjunctiva/sclera: Conjunctivae normal.  Cardiovascular:     Rate and Rhythm: Normal rate.  Pulmonary:     Effort: Pulmonary effort is normal. No respiratory distress.  Abdominal:     General: There is no distension.  Musculoskeletal:        General: Normal range of motion.     Cervical back: Neck supple.  Skin:    General: Skin is warm and dry.  Neurological:     Mental Status: She is alert and oriented to person, place, and time.      UC Treatments / Results  Labs (all labs ordered are listed, but only abnormal results are displayed) Labs Reviewed  CERVICOVAGINAL ANCILLARY ONLY    EKG   Radiology No results  found.  Procedures Procedures (including critical care time)  Medications Ordered in UC Medications - No data to display  Initial Impression / Assessment and Plan / UC Course  I have reviewed the triage vital signs and the nursing notes.  Pertinent labs & imaging results that were available during my care of the patient were reviewed by me and considered in my medical decision making (see chart for details).     Vaginal swab pending to evaluate discharge, empirically treating for yeast with Diflucan.  Alter therapy based off results if needed.  Discussed strict return precautions. Patient verbalized understanding and is agreeable with plan.  Final Clinical Impressions(s) / UC Diagnoses   Final diagnoses:  Vaginal discharge     Discharge Instructions     Diflucan for yeast  We are testing you for Gonorrhea, Chlamydia, Trichomonas, Yeast and Bacterial Vaginosis. We will call you if anything is positive and let you know if you require any further treatment. Please inform partners of any positive results.   Please return if symptoms not improving with treatment, development of fever, nausea, vomiting, abdominal pain.    ED Prescriptions    Medication Sig Dispense Auth. Provider   fluconazole (DIFLUCAN) 150 MG tablet Take 1 tablet (150 mg total) by mouth once for 1 dose. 2 tablet Tarry Blayney, [place] C, PA-C     PDMP not reviewed this encounter.   Lew Dawes, [place] 03/07/21 [phone]

## 2021-03-10 LAB — CERVICOVAGINAL ANCILLARY ONLY
Bacterial Vaginitis (gardnerella): POSITIVE — AB
Candida Glabrata: NEGATIVE
Candida Vaginitis: NEGATIVE
Chlamydia: NEGATIVE
Comment: NEGATIVE
Comment: NEGATIVE
Comment: NEGATIVE
Comment: NEGATIVE
Comment: NEGATIVE
Comment: NORMAL
Neisseria Gonorrhea: NEGATIVE
Trichomonas: NEGATIVE

## 2021-03-11 ENCOUNTER — Telehealth (HOSPITAL_COMMUNITY): Payer: Self-pay | Admitting: Emergency Medicine

## 2021-03-11 MED ORDER — METRONIDAZOLE 500 MG PO TABS: 500.0000 mg | ORAL_TABLET | Freq: Two times a day (BID) | ORAL | 0 refills | Status: DC

## 2021-04-20 MED FILL — Rivaroxaban Tab 20 MG: ORAL | 30 days supply | Qty: 30 | Fill #1 | Status: AC

## 2021-04-21 ENCOUNTER — Other Ambulatory Visit (HOSPITAL_BASED_OUTPATIENT_CLINIC_OR_DEPARTMENT_OTHER): Payer: Self-pay

## 2021-04-22 ENCOUNTER — Inpatient Hospital Stay: Payer: 59 | Attending: Family | Admitting: Family

## 2021-04-22 ENCOUNTER — Other Ambulatory Visit: Payer: Self-pay

## 2021-04-22 ENCOUNTER — Telehealth: Payer: Self-pay

## 2021-04-22 ENCOUNTER — Inpatient Hospital Stay: Payer: 59

## 2021-04-22 ENCOUNTER — Encounter: Payer: Self-pay | Admitting: Family

## 2021-04-22 VITALS — BP 119/62 | HR 75 | Temp 98.2°F | Resp 16 | Ht 62.0 in | Wt 206.8 lb

## 2021-04-22 DIAGNOSIS — Z7901 Long term (current) use of anticoagulants: Secondary | ICD-10-CM | POA: Insufficient documentation

## 2021-04-22 DIAGNOSIS — D6859 Other primary thrombophilia: Secondary | ICD-10-CM

## 2021-04-22 DIAGNOSIS — I82402 Acute embolism and thrombosis of unspecified deep veins of left lower extremity: Secondary | ICD-10-CM

## 2021-04-22 DIAGNOSIS — Z86718 Personal history of other venous thrombosis and embolism: Secondary | ICD-10-CM | POA: Diagnosis not present

## 2021-04-22 DIAGNOSIS — I82562 Chronic embolism and thrombosis of left calf muscular vein: Secondary | ICD-10-CM | POA: Insufficient documentation

## 2021-04-22 DIAGNOSIS — D500 Iron deficiency anemia secondary to blood loss (chronic): Secondary | ICD-10-CM

## 2021-04-22 LAB — CBC WITH DIFFERENTIAL (CANCER CENTER ONLY)
Abs Immature Granulocytes: 0.01 10*3/uL (ref 0.00–0.07)
Basophils Absolute: 0 10*3/uL (ref 0.0–0.1)
Basophils Relative: 1 %
Eosinophils Absolute: 0.1 10*3/uL (ref 0.0–0.5)
Eosinophils Relative: 1 %
HCT: 38 % (ref 36.0–46.0)
Hemoglobin: 12.2 g/dL (ref 12.0–15.0)
Immature Granulocytes: 0 %
Lymphocytes Relative: 25 %
Lymphs Abs: 1.5 10*3/uL (ref 0.7–4.0)
MCH: 28.7 pg (ref 26.0–34.0)
MCHC: 32.1 g/dL (ref 30.0–36.0)
MCV: 89.4 fL (ref 80.0–100.0)
Monocytes Absolute: 0.6 10*3/uL (ref 0.1–1.0)
Monocytes Relative: 9 %
Neutro Abs: 3.9 10*3/uL (ref 1.7–7.7)
Neutrophils Relative %: 64 %
Platelet Count: 257 10*3/uL (ref 150–400)
RBC: 4.25 MIL/uL (ref 3.87–5.11)
RDW: 11.9 % (ref 11.5–15.5)
WBC Count: 6.1 10*3/uL (ref 4.0–10.5)
nRBC: 0 % (ref 0.0–0.2)

## 2021-04-22 LAB — CMP (CANCER CENTER ONLY)
ALT: 16 U/L (ref 0–44)
AST: 15 U/L (ref 15–41)
Albumin: 4.2 g/dL (ref 3.5–5.0)
Alkaline Phosphatase: 54 U/L (ref 38–126)
Anion gap: 4 — ABNORMAL LOW (ref 5–15)
BUN: 14 mg/dL (ref 6–20)
CO2: 32 mmol/L (ref 22–32)
Calcium: 9.6 mg/dL (ref 8.9–10.3)
Chloride: 103 mmol/L (ref 98–111)
Creatinine: 0.6 mg/dL (ref 0.44–1.00)
GFR, Estimated: >60 mL/min (ref >60)
Glucose, Bld: 92 mg/dL (ref 70–99)
Potassium: 3.6 mmol/L (ref 3.5–5.1)
Sodium: 139 mmol/L (ref 135–145)
Total Bilirubin: 0.3 mg/dL (ref 0.3–1.2)
Total Protein: 6.6 g/dL (ref 6.5–8.1)

## 2021-04-22 LAB — LACTATE DEHYDROGENASE: LDH: 136 U/L (ref 98–192)

## 2021-04-22 NOTE — Progress Notes (Signed)
Hematology and Oncology Follow Up Visit  Felicia Lawrence [id number] 02-22-1991 30 y.o. 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 She is doing well.  Her cycle is still heavy but regular. No other blood loss noted. No abnormal bruising, no petechiae.  No fever, chills, n/v, cough, rash, dizziness, SOB, chest pain, palpitations, abdominal pain or changes in bowel or bladder habits.  No swelling, tenderness, numbness or tingling in her extremities at this time. She will occasional have numbness and tingling in her legs when sitting which resolves once she stands and moves around.   No falls or syncope to report.  She has maintained a good appetite and is staying well hydrated. Her weight is stable at 206 lbs.   ECOG Performance Status: 0 - Asymptomatic  Medications:  Allergies as of 04/22/2021   No Known Allergies      Medication List        Accurate as of April 22, 2021 11:23 AM. If you have any questions, ask your nurse or doctor.          STOP taking these medications    metroNIDAZOLE 500 MG tablet Commonly known as: FLAGYL Stopped by: Emeline Gins, NP       TAKE these medications    folic acid 1 MG tablet Commonly known as: FOLVITE Take 1 mg by mouth daily.   Xarelto 20 MG Tabs tablet Generic drug: rivaroxaban TAKE 1 TABLET BY MOUTH ONCE DAILY WITH SUPPER        Allergies: No Known Allergies  Past Medical History, Surgical history, Social history, and Family History were reviewed and updated.  Review of Systems: All other 10 point review of systems is negative.   Physical Exam:  height is 5\' 2"  (1.575 m) and weight is 206 lb 12.8 oz (93.8 kg). Her oral temperature is 98.2 F (36.8 C). Her blood pressure is  119/62 and her pulse is 75. Her respiration is 16 and oxygen saturation is 100%.   Wt Readings from Last 3 Encounters:  04/22/21 206 lb 12.8 oz (93.8 kg)  12/22/20 203 lb (92.1 kg)  12/18/20 202 lb 12.8 oz (92 kg)    Ocular: Sclerae unicteric, pupils equal, round and reactive to light Ear-nose-throat: Oropharynx clear, dentition fair Lymphatic: No cervical or supraclavicular adenopathy Lungs no rales or rhonchi, good excursion bilaterally Heart regular rate and rhythm, no murmur appreciated Abd soft, nontender, positive bowel sounds MSK no focal spinal tenderness, no joint edema Neuro: non-focal, well-oriented, appropriate affect Breasts: Deferred   Lab Results  Component Value Date   WBC 6.1 04/22/2021   HGB 12.2 04/22/2021   HCT 38.0 04/22/2021   MCV 89.4 04/22/2021   PLT 257 04/22/2021   Lab Results  Component Value Date   FERRITIN 20 12/22/2020   IRON 102 12/22/2020   TIBC 413 12/22/2020   UIBC 311 12/22/2020   IRONPCTSAT 25 12/22/2020   Lab Results  Component Value Date   RBC 4.25 04/22/2021   No results found for: KPAFRELGTCHN, LAMBDASER, KAPLAMBRATIO No results found for: IGGSERUM, IGA, IGMSERUM No results found for: TOTALPROTELP, ALBUMINELP, A1GS, A2GS, BETS, BETA2SER, GAMS, MSPIKE, SPEI   Chemistry      Component Value Date/Time   NA 139  04/22/2021 1050   K 3.6 04/22/2021 1050   CL 103 04/22/2021 1050   CO2 32 04/22/2021 1050   BUN 14 04/22/2021 1050   CREATININE 0.60 04/22/2021 1050      Component Value Date/Time   CALCIUM 9.6 04/22/2021 1050   ALKPHOS 54 04/22/2021 1050   AST 15 04/22/2021 1050   ALT 16 04/22/2021 1050   BILITOT 0.3 04/22/2021 1050       Impression and Plan: Felicia Lawrence is a very pleasant 30 yo female with protein S deficiency and history of DVT in the right lower extremity in 2018 and now 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  She continues to do well and is tolerating Xarelto nicely.  We will see her back  in December and reduce her to maintenance dosing at that times.  She can contact our office with any questions or concerns.   Emeline Gins, NP 7/13/202211:23 AM

## 2021-04-22 NOTE — Telephone Encounter (Signed)
Appts made per 04/22/21 los and pt req to view on mychart   Felicia Lawrence 

## 2021-04-24 ENCOUNTER — Ambulatory Visit: Payer: 59 | Admitting: Family

## 2021-04-24 ENCOUNTER — Other Ambulatory Visit: Payer: 59

## 2021-04-24 LAB — PROTEIN S, TOTAL: Protein S Ag, Total: 45 % — ABNORMAL LOW (ref 60–150)

## 2021-04-24 LAB — PROTEIN S ACTIVITY: Protein S Activity: 38 % — ABNORMAL LOW (ref 63–140)

## 2021-08-10 ENCOUNTER — Other Ambulatory Visit (HOSPITAL_BASED_OUTPATIENT_CLINIC_OR_DEPARTMENT_OTHER): Payer: Self-pay

## 2021-08-10 MED FILL — Rivaroxaban Tab 20 MG: ORAL | 30 days supply | Qty: 30 | Fill #2 | Status: AC

## 2021-09-09 ENCOUNTER — Other Ambulatory Visit: Payer: Self-pay | Admitting: Family

## 2021-09-21 ENCOUNTER — Encounter: Payer: Self-pay | Admitting: Family

## 2021-09-21 NOTE — Telephone Encounter (Signed)
Spoke with pt who states she does not have any redness, swelling, or heat at the site. Pt states she only has sharp pain when standing, walking or touching the leg. Pain location is in the right lower leg in between the calve and foot. Pt states she had the same symptoms the last time she had a DVT in her left lower leg. Pt confirmed she has been taking her Xarelto consistently with no missed doses.

## 2021-09-22 NOTE — Telephone Encounter (Signed)
Pt states she does not want to do any extra scans if not needed - pt would like Felicia Lawrence's opinion. [name] is ok to order u/s however if she would like to wait until tomorrows appointment to evaluate, then that's an option too. Per pt she would be fine waiting until the appointment tomorrow.

## 2021-09-23 ENCOUNTER — Inpatient Hospital Stay: Payer: 59 | Admitting: Family

## 2021-09-23 ENCOUNTER — Other Ambulatory Visit: Payer: Self-pay

## 2021-09-23 ENCOUNTER — Other Ambulatory Visit (HOSPITAL_BASED_OUTPATIENT_CLINIC_OR_DEPARTMENT_OTHER): Payer: Self-pay

## 2021-09-23 ENCOUNTER — Inpatient Hospital Stay: Payer: 59 | Attending: Hematology & Oncology

## 2021-09-23 ENCOUNTER — Other Ambulatory Visit: Payer: Self-pay | Admitting: *Deleted

## 2021-09-23 ENCOUNTER — Encounter: Payer: Self-pay | Admitting: Family

## 2021-09-23 VITALS — BP 120/78 | HR 78 | Temp 98.2°F | Resp 18 | Ht 62.0 in | Wt 207.1 lb

## 2021-09-23 DIAGNOSIS — Z86718 Personal history of other venous thrombosis and embolism: Secondary | ICD-10-CM | POA: Diagnosis not present

## 2021-09-23 DIAGNOSIS — I82402 Acute embolism and thrombosis of unspecified deep veins of left lower extremity: Secondary | ICD-10-CM | POA: Diagnosis not present

## 2021-09-23 DIAGNOSIS — Z7901 Long term (current) use of anticoagulants: Secondary | ICD-10-CM | POA: Insufficient documentation

## 2021-09-23 DIAGNOSIS — D6859 Other primary thrombophilia: Secondary | ICD-10-CM | POA: Insufficient documentation

## 2021-09-23 DIAGNOSIS — I82562 Chronic embolism and thrombosis of left calf muscular vein: Secondary | ICD-10-CM | POA: Insufficient documentation

## 2021-09-23 LAB — CBC WITH DIFFERENTIAL (CANCER CENTER ONLY)
Abs Immature Granulocytes: 0.02 10*3/uL (ref 0.00–0.07)
Basophils Absolute: 0 10*3/uL (ref 0.0–0.1)
Basophils Relative: 1 %
Eosinophils Absolute: 0.1 10*3/uL (ref 0.0–0.5)
Eosinophils Relative: 2 %
HCT: 39.2 % (ref 36.0–46.0)
Hemoglobin: 12.7 g/dL (ref 12.0–15.0)
Immature Granulocytes: 0 %
Lymphocytes Relative: 25 %
Lymphs Abs: 1.2 10*3/uL (ref 0.7–4.0)
MCH: 29.3 pg (ref 26.0–34.0)
MCHC: 32.4 g/dL (ref 30.0–36.0)
MCV: 90.5 fL (ref 80.0–100.0)
Monocytes Absolute: 0.5 10*3/uL (ref 0.1–1.0)
Monocytes Relative: 11 %
Neutro Abs: 3 10*3/uL (ref 1.7–7.7)
Neutrophils Relative %: 61 %
Platelet Count: 226 10*3/uL (ref 150–400)
RBC: 4.33 MIL/uL (ref 3.87–5.11)
RDW: 11.7 % (ref 11.5–15.5)
WBC Count: 4.8 10*3/uL (ref 4.0–10.5)
nRBC: 0 % (ref 0.0–0.2)

## 2021-09-23 LAB — CMP (CANCER CENTER ONLY)
ALT: 19 U/L (ref 0–44)
AST: 15 U/L (ref 15–41)
Albumin: 4 g/dL (ref 3.5–5.0)
Alkaline Phosphatase: 54 U/L (ref 38–126)
Anion gap: 8 (ref 5–15)
BUN: 19 mg/dL (ref 6–20)
CO2: 30 mmol/L (ref 22–32)
Calcium: 9.5 mg/dL (ref 8.9–10.3)
Chloride: 104 mmol/L (ref 98–111)
Creatinine: 0.69 mg/dL (ref 0.44–1.00)
GFR, Estimated: >60 mL/min (ref >60)
Glucose, Bld: 96 mg/dL (ref 70–99)
Potassium: 3.8 mmol/L (ref 3.5–5.1)
Sodium: 142 mmol/L (ref 135–145)
Total Bilirubin: 0.2 mg/dL — ABNORMAL LOW (ref 0.3–1.2)
Total Protein: 6.5 g/dL (ref 6.5–8.1)

## 2021-09-23 LAB — LACTATE DEHYDROGENASE: LDH: 136 U/L (ref 98–192)

## 2021-09-23 MED ORDER — RIVAROXABAN 20 MG PO TABS
ORAL_TABLET | Freq: Every day | ORAL | 4 refills | Status: DC
  Filled 2021-09-23: qty 30, 30d supply, fill #0
  Filled 2021-11-24: qty 30, 30d supply, fill #1
  Filled 2022-02-11: qty 30, 30d supply, fill #2

## 2021-09-23 NOTE — Progress Notes (Signed)
Hematology and Oncology Follow Up Visit  Felicia Lawrence [id number] 04/22/1991 30 y.o. 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 She states that she has had persistent pain and mild swelling in the right calf for the last week.  Pedal pulses are 2+. Negative Homan's sign.  Cycle only heavy on first day. She has the occasional nose bleed but states this is unchanged from childhood and she is able to stop easily.  No other blood loss noted. No bruising or petechiae.  No fever, chills, n/v, cough, rash, dizziness, SOB, chest pain, palpitations, abdominal pain or changes in bowel or bladder habits.  No numbness or tingling in her extremities at this time.  No falls or syncope.  She is eating healthier and staying well hydrated. Her weight is stable at 207 lbs.   ECOG Performance Status: 1 - Symptomatic but completely ambulatory  Medications:  Allergies as of 09/23/2021   No Known Allergies      Medication List        Accurate as of September 23, 2021  8:48 AM. If you have any questions, ask your nurse or doctor.          folic acid 1 MG tablet Commonly known as: FOLVITE Take 1 mg by mouth daily.   Xarelto 20 MG Tabs tablet Generic drug: rivaroxaban TAKE 1 TABLET BY MOUTH ONCE DAILY WITH SUPPER        Allergies: No Known Allergies  Past Medical History, Surgical history, Social history, and Family History were reviewed and updated.  Review of Systems: All other 10 point review of systems is negative.   Physical Exam:  vitals were not taken for this visit.   Wt Readings from Last 3 Encounters:  04/22/21 206 lb 12.8 oz (93.8 kg)  12/22/20 203 lb (92.1 kg)  12/18/20 202 lb 12.8 oz (92 kg)    Ocular: Sclerae unicteric, pupils  equal, round and reactive to light Ear-nose-throat: Oropharynx clear, dentition fair Lymphatic: No cervical or supraclavicular adenopathy Lungs no rales or rhonchi, good excursion bilaterally Heart regular rate and rhythm, no murmur appreciated Abd soft, nontender, positive bowel sounds MSK no focal spinal tenderness, no joint edema Neuro: non-focal, well-oriented, appropriate affect Breasts: Deferred   Lab Results  Component Value Date   WBC 4.8 09/23/2021   HGB 12.7 09/23/2021   HCT 39.2 09/23/2021   MCV 90.5 09/23/2021   PLT 226 09/23/2021   Lab Results  Component Value Date   FERRITIN 20 12/22/2020   IRON 102 12/22/2020   TIBC 413 12/22/2020   UIBC 311 12/22/2020   IRONPCTSAT 25 12/22/2020   Lab Results  Component Value Date   RBC 4.33 09/23/2021   No results found for: KPAFRELGTCHN, LAMBDASER, KAPLAMBRATIO No results found for: IGGSERUM, IGA, IGMSERUM No results found for: Dorene Ar, A1GS, A2GS, Colin Benton, MSPIKE, SPEI   Chemistry      Component Value Date/Time   NA 139 04/22/2021 1050   K 3.6 04/22/2021 1050   CL 103 04/22/2021 1050   CO2 32 04/22/2021 1050   BUN 14 04/22/2021 1050   CREATININE 0.60 04/22/2021 1050      Component Value Date/Time   CALCIUM 9.6 04/22/2021 1050   ALKPHOS  54 04/22/2021 1050   AST 15 04/22/2021 1050   ALT 16 04/22/2021 1050   BILITOT 0.3 04/22/2021 1050       Impression and Plan: Felicia Lawrence is a very pleasant 30 yo female with protein S deficiency and history of DVT in the right lower extremity in 2018 and now 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  Patient symptomatic as mentioned above. We will get an US of the right leg to assess for recurrent DVT. Xarelto refilled. She will continue on her same dose for now.  Follow-up in 6 months.   Eileen Stanford, NP 12/14/20228:48 AM

## 2021-09-24 ENCOUNTER — Telehealth: Payer: Self-pay | Admitting: *Deleted

## 2021-09-24 NOTE — Telephone Encounter (Signed)
Per 09/23/21 los - called and gave upcoming appointments - confirmed °

## 2021-09-25 ENCOUNTER — Other Ambulatory Visit: Payer: Self-pay

## 2021-09-25 ENCOUNTER — Ambulatory Visit (HOSPITAL_BASED_OUTPATIENT_CLINIC_OR_DEPARTMENT_OTHER)
Admission: RE | Admit: 2021-09-25 | Discharge: 2021-09-25 | Disposition: A | Discharge status: Home / Self Care | Payer: 59 | Source: Ambulatory Visit | Attending: Family | Admitting: Family

## 2021-09-25 DIAGNOSIS — Z86718 Personal history of other venous thrombosis and embolism: Secondary | ICD-10-CM | POA: Diagnosis not present

## 2021-09-25 LAB — PROTEIN S ACTIVITY: Protein S Activity: 17 % — ABNORMAL LOW (ref 63–140)

## 2021-09-25 LAB — PROTEIN S, TOTAL: Protein S Ag, Total: 58 % — ABNORMAL LOW (ref 60–150)

## 2021-09-26 ENCOUNTER — Other Ambulatory Visit: Payer: Self-pay

## 2021-09-26 ENCOUNTER — Encounter: Payer: Self-pay | Admitting: Emergency Medicine

## 2021-09-26 ENCOUNTER — Ambulatory Visit
Admission: EM | Admit: 2021-09-26 | Discharge: 2021-09-26 | Disposition: A | Discharge status: Home / Self Care | Payer: 59 | Attending: Internal Medicine | Admitting: Internal Medicine

## 2021-09-26 DIAGNOSIS — N898 Other specified noninflammatory disorders of vagina: Secondary | ICD-10-CM | POA: Diagnosis not present

## 2021-09-26 DIAGNOSIS — Z113 Encounter for screening for infections with a predominantly sexual mode of transmission: Secondary | ICD-10-CM | POA: Diagnosis not present

## 2021-09-26 NOTE — ED Triage Notes (Signed)
White vaginal discharge starting around a week ago. Describes it as in between thick-thin, no odor, denies itching, burning, recent antibiotic use, urinary problems, possible pregnancy, lower abdominal pain, douching, new partner/unprotected sex recently.

## 2021-09-26 NOTE — Discharge Instructions (Signed)
Your vaginal swab is pending.  We will call if it is positive and treat as appropriate.  Please refrain from sexual activity until test results and treatment are complete. ?

## 2021-09-26 NOTE — ED Provider Notes (Signed)
[facility]    CSN: [id number] Arrival date & time: 09/26/21  1052      History   Chief Complaint Chief Complaint  Patient presents with   Vaginal Discharge    HPI Felicia Lawrence is a 30 y.o. female.   Patient presents with white vaginal discharge that started approximately 1 week ago.  Denies urinary burning, urinary frequency, irregular vaginal bleeding, hematuria, pelvic pain, abdominal pain, fever, back pain.  Denies any known exposure to STD.  Denies concerns for pregnancy.   Vaginal Discharge  Past Medical History:  Diagnosis Date   Asthma    only as a child.   DVT (deep venous thrombosis) ([place])     There are no problems to display for this patient.   History reviewed. No pertinent surgical history.  OB History   No obstetric history on file.      Home Medications    Prior to Admission medications   Medication Sig Start Date End Date Taking? Authorizing Provider  folic acid (FOLVITE) 1 MG tablet Take 1 mg by mouth daily.    [provider]  rivaroxaban (XARELTO) 20 MG TABS tablet TAKE 1 TABLET BY MOUTH ONCE DAILY WITH SUPPER 09/23/21 09/23/22  Celso Amy, NP    Family History Family History  Problem Relation Age of Onset   Clotting disorder Mother     Social History Social History   Tobacco Use   Smoking status: Never   Smokeless tobacco: Never  Vaping Use   Vaping Use: Never used  Substance Use Topics   Alcohol use: No   Drug use: No     Allergies   Patient has no known allergies.   Review of Systems Review of Systems Per HPI  Physical Exam Triage Vital Signs ED Triage Vitals [09/26/21 1231]  Enc Vitals Group     BP (!) 143/83     Pulse Rate 74     Resp 16     Temp 98.1 F (36.7 C)     Temp Source Oral     SpO2 99 %     Weight      Height      Head Circumference      Peak Flow      Pain Score 0     Pain Loc      Pain Edu?      Excl. in [place]?    No data found.  Updated Vital  Signs BP (!) 143/83 (BP Location: Left Arm)    Pulse 74    Temp 98.1 F (36.7 C) (Oral)    Resp 16    SpO2 99%   Visual Acuity Right Eye Distance:   Left Eye Distance:   Bilateral Distance:    Right Eye Near:   Left Eye Near:    Bilateral Near:     Physical Exam Constitutional:      General: She is not in acute distress.    Appearance: Normal appearance. She is not toxic-appearing or diaphoretic.  HENT:     Head: Normocephalic and atraumatic.  Eyes:     Extraocular Movements: Extraocular movements intact.     Conjunctiva/sclera: Conjunctivae normal.  Pulmonary:     Effort: Pulmonary effort is normal.  Genitourinary:    Comments: Deferred with shared decision making.  Self swab performed. Neurological:     General: No focal deficit present.     Mental Status: She is alert and oriented to person, place, and time. Mental status is  at baseline.  Psychiatric:        Mood and Affect: Mood normal.        Behavior: Behavior normal.        Thought Content: Thought content normal.        Judgment: Judgment normal.     UC Treatments / Results  Labs (all labs ordered are listed, but only abnormal results are displayed) Labs Reviewed  CERVICOVAGINAL ANCILLARY ONLY    EKG   Radiology US Venous Img Lower Unilateral Right (DVT)  Result Date: 09/25/2021 CLINICAL DATA:  30 year old with history of DVT. Calf pain and swelling. EXAM: RIGHT LOWER EXTREMITY VENOUS DOPPLER ULTRASOUND TECHNIQUE: Gray-scale sonography with graded compression, as well as color Doppler and duplex ultrasound were performed to evaluate the lower extremity deep venous systems from the level of the common femoral vein and including the common femoral, femoral, profunda femoral, popliteal and calf veins including the posterior tibial, peroneal and gastrocnemius veins when visible. The superficial great saphenous vein was also interrogated. Spectral Doppler was utilized to evaluate flow at rest and with distal  augmentation maneuvers in the common femoral, femoral and popliteal veins. COMPARISON:  None. FINDINGS: Contralateral Common Femoral Vein: No evidence of thrombus. Normal compressibility and color Doppler flow. Common Femoral Vein: No evidence of thrombus. Normal compressibility, phasicity and color Doppler flow. Saphenofemoral Junction: No evidence of thrombus. Normal compressibility and flow on color Doppler imaging. Profunda Femoral Vein: No evidence of thrombus. Normal compressibility and flow on color Doppler imaging. Femoral Vein: No evidence of thrombus. Normal compressibility, phasicity and color Doppler flow. Popliteal Vein: No evidence of thrombus. Normal compressibility, color Doppler flow and phasicity. Calf Veins: Visualized right deep calf veins are patent without thrombus. Superficial Great Saphenous Vein: No evidence of thrombus. Normal compressibility. Other Findings:  None. IMPRESSION: Negative for deep venous thrombosis in right lower extremity. Electronically Signed   By: Richarda Overlie M.D.   On: 09/25/2021 18:07    Procedures Procedures (including critical care time)  Medications Ordered in UC Medications - No data to display  Initial Impression / Assessment and Plan / UC Course  I have reviewed the triage vital signs and the nursing notes.  Pertinent labs & imaging results that were available during my care of the patient were reviewed by me and considered in my medical decision making (see chart for details).     Cervicovaginal swab pending.  Will await results before treatment.  Do not think that urinalysis is necessary given that there are no urine symptoms on exam.  Patient to refrain from sexual activity until test results and treatment are complete.  Discussed return precautions.  Patient verbalized understanding and was agreeable with plan. Final Clinical Impressions(s) / UC Diagnoses   Final diagnoses:  Vaginal discharge  Screening examination for venereal disease      Discharge Instructions      Your vaginal swab is pending.  We will call if it is positive and treat as appropriate.  Please refrain from sexual activity until test results and treatment are complete.    ED Prescriptions   None    PDMP not reviewed this encounter.   Gustavus Bryant, [place] 09/26/21 1252

## 2021-09-28 ENCOUNTER — Encounter: Payer: Self-pay | Admitting: *Deleted

## 2021-09-28 LAB — CERVICOVAGINAL ANCILLARY ONLY
Bacterial Vaginitis (gardnerella): NEGATIVE
Candida Glabrata: NEGATIVE
Candida Vaginitis: NEGATIVE
Chlamydia: NEGATIVE
Comment: NEGATIVE
Comment: NEGATIVE
Comment: NEGATIVE
Comment: NEGATIVE
Comment: NEGATIVE
Comment: NORMAL
Neisseria Gonorrhea: NEGATIVE
Trichomonas: NEGATIVE

## 2021-11-24 ENCOUNTER — Other Ambulatory Visit (HOSPITAL_BASED_OUTPATIENT_CLINIC_OR_DEPARTMENT_OTHER): Payer: Self-pay

## 2021-11-27 ENCOUNTER — Other Ambulatory Visit (HOSPITAL_BASED_OUTPATIENT_CLINIC_OR_DEPARTMENT_OTHER): Payer: Self-pay

## 2021-11-27 ENCOUNTER — Ambulatory Visit: Payer: 59 | Admitting: Family

## 2021-11-27 ENCOUNTER — Encounter: Payer: Self-pay | Admitting: Family

## 2021-11-27 VITALS — BP 120/66 | HR 69 | Temp 98.0°F | Ht 63.0 in | Wt 201.0 lb

## 2021-11-27 DIAGNOSIS — J069 Acute upper respiratory infection, unspecified: Secondary | ICD-10-CM | POA: Diagnosis not present

## 2021-11-27 DIAGNOSIS — J309 Allergic rhinitis, unspecified: Secondary | ICD-10-CM

## 2021-11-27 MED ORDER — FLUTICASONE PROPIONATE 50 MCG/ACT NA SUSP
2.0000 | Freq: Every day | NASAL | 6 refills | Status: DC
  Filled 2021-11-27: qty 16, 30d supply, fill #0

## 2021-11-27 MED ORDER — LORATADINE 10 MG PO TABS
10.0000 mg | ORAL_TABLET | Freq: Every day | ORAL | 0 refills | Status: DC
  Filled 2021-11-27: qty 100, 100d supply, fill #0

## 2021-11-27 MED ORDER — AZITHROMYCIN 250 MG PO TABS
ORAL_TABLET | ORAL | 0 refills | Status: DC
  Filled 2021-11-27: qty 6, 5d supply, fill #0

## 2021-11-27 NOTE — Progress Notes (Signed)
Felicia Lawrence is a 31 y.o. female with the following history as recorded in EpicCare:  There are no problems to display for this patient.   Current Outpatient Medications  Medication Sig Dispense Refill   azithromycin (ZITHROMAX Z-PAK) 250 MG tablet Take 2 tablets by mouth on day 1, then take 1 tablet daily until finished 6 each 0   fluticasone (FLONASE) 50 MCG/ACT nasal spray Place 2 sprays into both nostrils daily. 16 g 6   folic acid (FOLVITE) 1 MG tablet Take 1 mg by mouth daily.     loratadine (CLARITIN) 10 MG tablet Take 1 tablet (10 mg total) by mouth daily. 100 tablet 0   rivaroxaban (XARELTO) 20 MG TABS tablet TAKE 1 TABLET BY MOUTH ONCE DAILY WITH SUPPER 30 tablet 4   No current facility-administered medications for this visit.    Allergies: Patient has no known allergies.  Past Medical History:  Diagnosis Date   Asthma    only as a child.   DVT (deep venous thrombosis) ([place])     No past surgical history on file.  Family History  Problem Relation Age of Onset   Clotting disorder Mother     Social History   Tobacco Use   Smoking status: Never   Smokeless tobacco: Never  Substance Use Topics   Alcohol use: No    Subjective:  Cough x 1 week; feels like more problematic at night; has been using OTC Mucinex; did seem better last night; "feels like drainage in back of throat."  LMP 11/03/2021    Objective:  Vitals:   11/27/21 1031  BP: 120/66  Pulse: 69  Temp: 98 F (36.7 C)  TempSrc: Oral  SpO2: 97%  Weight: 201 lb (91.2 kg)  Height: 5\' 3"  (1.6 m)    General: Well developed, well nourished, in no acute distress  Skin : Warm and dry.  Head: Normocephalic and atraumatic  Eyes: Sclera and conjunctiva clear; pupils round and reactive to light; extraocular movements intact  Ears: External normal; canals clear; tympanic membranes normal  Oropharynx: Pink, supple. No suspicious lesions  Neck: Supple without thyromegaly, adenopathy  Lungs: Respirations  unlabored; clear to auscultation bilaterally without wheeze, rales, rhonchi  CVS exam: normal rate and regular rhythm.  Neurologic: Alert and oriented; speech intact; face symmetrical; moves all extremities well; CNII-XII intact without focal deficit   Assessment:  1. Acute URI   2. Allergic rhinitis, unspecified seasonality, unspecified trigger     Plan:  Suspect allergic rhinitis; Rx for Z-pak #1 take as directed, Claritin and Flonase; increase fluids, rest and follow up worse, no better.   This visit occurred during the SARS-CoV-2 public health emergency.  Safety protocols were in place, including screening questions prior to the visit, additional usage of staff PPE, and extensive cleaning of exam room while observing appropriate contact time as indicated for disinfecting solutions.    No follow-ups on file.  No orders of the defined types were placed in this encounter.   Requested Prescriptions   Signed Prescriptions Disp Refills   loratadine (CLARITIN) 10 MG tablet 100 tablet 0    Sig: Take 1 tablet (10 mg total) by mouth daily.   fluticasone (FLONASE) 50 MCG/ACT nasal spray 16 g 6    Sig: Place 2 sprays into both nostrils daily.   azithromycin (ZITHROMAX Z-PAK) 250 MG tablet 6 each 0    Sig: Take 2 tablets by mouth on day 1, then take 1 tablet daily until finished

## 2022-02-11 ENCOUNTER — Ambulatory Visit: Payer: 59 | Admitting: Medical

## 2022-02-11 ENCOUNTER — Other Ambulatory Visit (HOSPITAL_BASED_OUTPATIENT_CLINIC_OR_DEPARTMENT_OTHER): Payer: Self-pay

## 2022-02-11 VITALS — BP 116/74 | HR 58 | Temp 98.2°F | Resp 18 | Ht 63.0 in | Wt 200.4 lb

## 2022-02-11 DIAGNOSIS — L299 Pruritus, unspecified: Secondary | ICD-10-CM | POA: Diagnosis not present

## 2022-02-11 DIAGNOSIS — T7840XA Allergy, unspecified, initial encounter: Secondary | ICD-10-CM

## 2022-02-11 NOTE — Patient Instructions (Addendum)
Recent daily itching with intermittent appearing rash for one month. Appear allergic reaction but etiology undetermined. Continue current daily antihistamine regimen zyrtec with benadryl tab at night if needed for itching. Keep skin well moisturized. ? ?Placing referral to allergist. If above is not adequate for control/you are having persit itch with rash then could rx taper medrol.  ? ?In event of severe allergic reaction type event as described then recommend ED evaluation. ? ?Follow up with me as needed pending allergist appointment. ?

## 2022-02-11 NOTE — Progress Notes (Signed)
? ?Subjective:  ? ? Patient ID: Felicia Lawrence, female    DOB: 09-01-1991, 31 y.o.   MRN: [id number] ? ?HPI ? ?Pt states she recently has some itching all over there body. She describes the itching is in random area. States has occurred for one month. She states will rash with itching. She shows me picture of left thigh and back of leg that had red rash. Occurred last night and occurred for 30 minutes. At tmes her feet will itch but does not have rash. She also gets some itching of both arms at times with rash that occurs for about 30 minutes. ? ?Pt has recent rash that itches. Reports rash occurred after no known particular exposure. On review pt does not report any suspicious exposure to soaps, creams, detergents, lotions, detergents, animal  exposure, plants or insect bites. Pt reports no shortness of breath or wheezing. No lip swelling and no sob. ? ?Pt denies no recent anxiety/stress. ? ?Pt states when takes claritin or zytec itching will resolve. ? ?No recent allergic rhinitis symptoms.  ? ?Review of Systems  ?Constitutional:  Negative for chills, fatigue and fever.  ?Respiratory:  Negative for cough, chest tightness, shortness of breath and wheezing.   ?Cardiovascular:  Negative for chest pain and palpitations.  ?Gastrointestinal:  Negative for abdominal pain.  ?Musculoskeletal:  Negative for back pain.  ?Skin:   ?     See hpi.  ?Neurological:  Negative for dizziness, light-headedness and headaches.  ?Hematological:  Negative for adenopathy. Does not bruise/bleed easily.  ?Psychiatric/Behavioral:  Negative for behavioral problems and dysphoric mood.   ? ? ?Past Medical History:  ?Diagnosis Date  ? Asthma   ? only as a child.  ? DVT (deep venous thrombosis) ([facility])   ? ?  ?Social History  ? ?Socioeconomic History  ? Marital status: Single  ?  Spouse name: Not on file  ? Number of children: Not on file  ? Years of education: Not on file  ? Highest education level: Not on file  ?Occupational History  ? Not on  file  ?Tobacco Use  ? Smoking status: Never  ? Smokeless tobacco: Never  ?Vaping Use  ? Vaping Use: Never used  ?Substance and Sexual Activity  ? Alcohol use: No  ? Drug use: No  ? Sexual activity: Yes  ?  Birth control/protection: None  ?Other Topics Concern  ? Not on file  ?Social History Narrative  ? Not on file  ? ?Social Determinants of Health  ? ?Financial Resource Strain: Not on file  ?Food Insecurity: Not on file  ?Transportation Needs: Not on file  ?Physical Activity: Not on file  ?Stress: Not on file  ?Social Connections: Not on file  ?Intimate Partner Violence: Not on file  ? ? ?No past surgical history on file. ? ?Family History  ?Problem Relation Age of Onset  ? Clotting disorder Mother   ? ? ?No Known Allergies ? ?Current Outpatient Medications on File Prior to Visit  ?Medication Sig Dispense Refill  ? rivaroxaban (XARELTO) 20 MG TABS tablet TAKE 1 TABLET BY MOUTH ONCE DAILY WITH SUPPER 30 tablet 4  ? ?No current facility-administered medications on file prior to visit.  ? ? ?BP 116/74   Pulse (!) 58   Temp 98.2 ?F (36.8 ?C)   Resp 18   Ht 5\' 3"  (1.6 m)   Wt 200 lb 6.4 oz (90.9 kg)   LMP 01/19/2022   SpO2 93%   BMI 35.50 kg/m?  ?  ?   ?  Objective:  ? Physical Exam ? ?General- No acute distress. Pleasant patient. ?Neck- Full range of motion, no jvd ?Lungs- Clear, even and unlabored. ?Heart- regular rate and rhythm. ?Neurologic- CNII- XII grossly intact.  ?Skin- on inspection arms and legs no rash presently. ? ? ?   ?Assessment & Plan:  ? ?Patient Instructions  ?Recent daily itching with intermittent appearing rash for one month. Appear allergic reaction but etiology undetermined. Continue current daily antihistamine regimen zyrtec with benadryl tab at night if needed for itching. Keep skin well moisturized. ? ?Placing referral to allergist. If above is not adequate for control/you are having persit itch with rash then could rx taper medrol.  ? ?In event of severe allergic reaction type event as  described then recommend ED evaluation. ? ?Follow up with me as needed pending allergist appointment.  ? ?Esperanza Richters, PA-C  ?

## 2022-03-11 ENCOUNTER — Ambulatory Visit: Payer: 59 | Admitting: Medical

## 2022-03-11 ENCOUNTER — Other Ambulatory Visit (HOSPITAL_BASED_OUTPATIENT_CLINIC_OR_DEPARTMENT_OTHER): Payer: Self-pay

## 2022-03-11 VITALS — BP 109/60 | HR 89 | Temp 98.3°F | Resp 18 | Ht 63.0 in | Wt 200.0 lb

## 2022-03-11 DIAGNOSIS — J029 Acute pharyngitis, unspecified: Secondary | ICD-10-CM | POA: Diagnosis not present

## 2022-03-11 DIAGNOSIS — L299 Pruritus, unspecified: Secondary | ICD-10-CM | POA: Diagnosis not present

## 2022-03-11 LAB — POCT RAPID STREP A (OFFICE): Rapid Strep A Screen: NEGATIVE

## 2022-03-11 MED ORDER — AZITHROMYCIN 250 MG PO TABS
ORAL_TABLET | ORAL | 0 refills | Status: AC
  Filled 2022-03-11: qty 6, 5d supply, fill #0

## 2022-03-11 MED ORDER — HYDROXYZINE HCL 10 MG PO TABS
ORAL_TABLET | ORAL | 0 refills | Status: DC
  Filled 2022-03-11: qty 30, 5d supply, fill #0

## 2022-03-11 NOTE — Patient Instructions (Signed)
Your strep test was negative. However, your physical exam and clinical presentation is suspicious for strep and it is important to note that rapid strep test can be falsely negative. So I am going to give you a antibiotic today based on your exam and clinical presentation.  Rest hydrate, tylenol for fever, and warm salt water gargles.   For hx of rash and itching making hydroxyzine available pending your appointment with allergist. Rx advisement given.  Follow up in 7 days or as needed.

## 2022-03-11 NOTE — Progress Notes (Signed)
   Subjective:    Patient ID: Felicia Lawrence, female    DOB: [birth year], 31 y.o.   MRN: [id number]  HPI  Pt in for about 7-9 days of st. Also has some ear pain. Pain mostly in morning on waking.   No fever, no chills or sweats. No nasal congestion. Yesterday morning when cleared throat saw some mucus.  Pt has not taken any meds for signs or symptoms.   On review of last visit pt shows me her both calf rash that came up that came up on Monday. Pt has appointment with allergist on April 08, 2022.  Review of Systems  Constitutional:  Negative for chills, fatigue and fever.  HENT:  Positive for sore throat.   Cardiovascular:  Negative for chest pain and palpitations.  Gastrointestinal:  Negative for abdominal pain.  Genitourinary:  Negative for dysuria and frequency.  Musculoskeletal:  Negative for back pain.  Skin:  Negative for rash.       See hpi.       Objective:   Physical Exam  General Mental Status- Alert. General Appearance- Not in acute distress.   Skin General: Color- Normal Color. Moisture- Normal Moisture.  Neck Carotid Arteries- Normal color. Moisture- Normal Moisture. No carotid bruits. No JVD.  Chest and Lung Exam Auscultation: Breath Sounds:-Normal.  Cardiovascular Auscultation:Rythm- Regular. Murmurs & Other Heart Sounds:Auscultation of the heart reveals- No Murmurs.    Neurologic Cranial Nerve exam:- CN III-XII intact(No nystagmus), symmetric smile. Strength:- 5/5 equal and symmetric strength both upper and lower extremities.   Heent- moderate red posterior pharynx. Mild tender submandibular nodes bilaterally.   Skin- no rash presently. Pictures on phone shows red rash on calfs this Monday.    Assessment & Plan:   Patient Instructions  Your strep test was negative. However, your physical exam and clinical presentation is suspicious for strep and it is important to note that rapid strep test can be falsely negative. So I am going to give you  a antibiotic today based on your exam and clinical presentation.  Rest hydrate, tylenol for fever, and warm salt water gargles.   For hx of rash and itching making hydroxyzine available pending your appointment with allergist. Rx advisement given.  Follow up in 7 days or as needed.

## 2022-03-24 ENCOUNTER — Inpatient Hospital Stay: Payer: 59 | Admitting: Family

## 2022-03-24 ENCOUNTER — Other Ambulatory Visit (HOSPITAL_BASED_OUTPATIENT_CLINIC_OR_DEPARTMENT_OTHER): Payer: Self-pay

## 2022-03-24 ENCOUNTER — Inpatient Hospital Stay: Payer: 59 | Attending: Hematology & Oncology

## 2022-03-24 ENCOUNTER — Other Ambulatory Visit: Payer: Self-pay

## 2022-03-24 ENCOUNTER — Other Ambulatory Visit: Payer: Self-pay | Admitting: Lab

## 2022-03-24 ENCOUNTER — Encounter: Payer: Self-pay | Admitting: Family

## 2022-03-24 DIAGNOSIS — I82462 Acute embolism and thrombosis of left calf muscular vein: Secondary | ICD-10-CM | POA: Diagnosis not present

## 2022-03-24 DIAGNOSIS — D6859 Other primary thrombophilia: Secondary | ICD-10-CM | POA: Diagnosis present

## 2022-03-24 DIAGNOSIS — I82402 Acute embolism and thrombosis of unspecified deep veins of left lower extremity: Secondary | ICD-10-CM | POA: Diagnosis not present

## 2022-03-24 DIAGNOSIS — Z7901 Long term (current) use of anticoagulants: Secondary | ICD-10-CM | POA: Insufficient documentation

## 2022-03-24 DIAGNOSIS — Z86718 Personal history of other venous thrombosis and embolism: Secondary | ICD-10-CM

## 2022-03-24 LAB — CBC WITH DIFFERENTIAL (CANCER CENTER ONLY)
Abs Immature Granulocytes: 0.01 10*3/uL (ref 0.00–0.07)
Basophils Absolute: 0 10*3/uL (ref 0.0–0.1)
Basophils Relative: 1 %
Eosinophils Absolute: 0.1 10*3/uL (ref 0.0–0.5)
Eosinophils Relative: 2 %
HCT: 38.4 % (ref 36.0–46.0)
Hemoglobin: 12.4 g/dL (ref 12.0–15.0)
Immature Granulocytes: 0 %
Lymphocytes Relative: 26 %
Lymphs Abs: 1.2 10*3/uL (ref 0.7–4.0)
MCH: 29 pg (ref 26.0–34.0)
MCHC: 32.3 g/dL (ref 30.0–36.0)
MCV: 89.9 fL (ref 80.0–100.0)
Monocytes Absolute: 0.4 10*3/uL (ref 0.1–1.0)
Monocytes Relative: 8 %
Neutro Abs: 2.8 10*3/uL (ref 1.7–7.7)
Neutrophils Relative %: 63 %
Platelet Count: 275 10*3/uL (ref 150–400)
RBC: 4.27 MIL/uL (ref 3.87–5.11)
RDW: 11.8 % (ref 11.5–15.5)
WBC Count: 4.5 10*3/uL (ref 4.0–10.5)
nRBC: 0 % (ref 0.0–0.2)

## 2022-03-24 LAB — CMP (CANCER CENTER ONLY)
ALT: 36 U/L (ref 0–44)
AST: 32 U/L (ref 15–41)
Albumin: 4.1 g/dL (ref 3.5–5.0)
Alkaline Phosphatase: 56 U/L (ref 38–126)
Anion gap: 5 (ref 5–15)
BUN: 16 mg/dL (ref 6–20)
CO2: 32 mmol/L (ref 22–32)
Calcium: 9.3 mg/dL (ref 8.9–10.3)
Chloride: 105 mmol/L (ref 98–111)
Creatinine: 0.64 mg/dL (ref 0.44–1.00)
GFR, Estimated: >60 mL/min (ref >60)
Glucose, Bld: 93 mg/dL (ref 70–99)
Potassium: 3.9 mmol/L (ref 3.5–5.1)
Sodium: 142 mmol/L (ref 135–145)
Total Bilirubin: 0.4 mg/dL (ref 0.3–1.2)
Total Protein: 6.5 g/dL (ref 6.5–8.1)

## 2022-03-24 LAB — LACTATE DEHYDROGENASE: LDH: 144 U/L (ref 98–192)

## 2022-03-24 MED ORDER — RIVAROXABAN 20 MG PO TABS
ORAL_TABLET | Freq: Every day | ORAL | 2 refills | Status: DC
  Filled 2022-03-24 – 2022-05-14 (×2): qty 30, 30d supply, fill #0
  Filled 2022-07-27: qty 30, 30d supply, fill #1

## 2022-03-24 NOTE — Progress Notes (Signed)
Hematology and Oncology Follow Up Visit  Felicia Lawrence [id number] January 28, 1991 31 y.o. 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 She is doing well and has no complaints at this time.  She is tolerating Xarelto nicely.  No abnormal blood loss. No bruising or petechiae.  She states that has the occasional light nose bleed.  Her cycle is regular and flow normal.  No fever, chills, n/v, cough, rash, dizziness, SOB, chest pain, palpitations, abdominal pain or changes in bowel or bladder habits.  No swelling, tenderness, numbness or tingling in her extremities. No falls or syncope.  She has maintained a good appetite and is staying well hydrated. Her weight is stable at 201 lbs.   ECOG Performance Status: 1 - Symptomatic but completely ambulatory  Medications:  Allergies as of 03/24/2022   No Known Allergies      Medication List        Accurate as of March 24, 2022 10:21 AM. If you have any questions, ask your nurse or doctor.          hydrOXYzine 10 MG tablet Commonly known as: ATARAX Take 1 - 2 tablets by mouth every 8 hours as needed for itching/rash   Xarelto 20 MG Tabs tablet Generic drug: rivaroxaban TAKE 1 TABLET BY MOUTH ONCE DAILY WITH SUPPER        Allergies: No Known Allergies  Past Medical History, Surgical history, Social history, and Family History were reviewed and updated.  Review of Systems: All other 10 point review of systems is negative.   Physical Exam:  height is 5\' 3"  (1.6 m) and weight is 201 lb (91.2 kg). Her oral temperature is 98.3 F (36.8 C). Her blood pressure is 109/66 and her pulse is 69. Her respiration is 18 and oxygen saturation is 100%.   Wt Readings from Last 3 Encounters:   03/24/22 201 lb (91.2 kg)  03/11/22 200 lb (90.7 kg)  02/11/22 200 lb 6.4 oz (90.9 kg)    Ocular: Sclerae unicteric, pupils equal, round and reactive to light Ear-nose-throat: Oropharynx clear, dentition fair Lymphatic: No cervical or supraclavicular adenopathy Lungs no rales or rhonchi, good excursion bilaterally Heart regular rate and rhythm, no murmur appreciated Abd soft, nontender, positive bowel sounds MSK no focal spinal tenderness, no joint edema Neuro: non-focal, well-oriented, appropriate affect Breasts: Deferred   Lab Results  Component Value Date   WBC 4.5 03/24/2022   HGB 12.4 03/24/2022   HCT 38.4 03/24/2022   MCV 89.9 03/24/2022   PLT 275 03/24/2022   Lab Results  Component Value Date   FERRITIN 20 12/22/2020   IRON 102 12/22/2020   TIBC 413 12/22/2020   UIBC 311 12/22/2020   IRONPCTSAT 25 12/22/2020   Lab Results  Component Value Date   RBC 4.27 03/24/2022   No results found for: "KPAFRELGTCHN", "LAMBDASER", "KAPLAMBRATIO" No results found for: "IGGSERUM", "IGA", "IGMSERUM" No results found for: "TOTALPROTELP", "ALBUMINELP", "A1GS", "A2GS", "BETS", "BETA2SER", "GAMS", "MSPIKE", "SPEI"   Chemistry      Component Value Date/Time   NA 142 09/23/2021 0824   K 3.8 09/23/2021 0824   CL 104 09/23/2021 0824   CO2 30 09/23/2021 0824   BUN 19 09/23/2021 0824  CREATININE 0.69 09/23/2021 0824      Component Value Date/Time   CALCIUM 9.5 09/23/2021 0824   ALKPHOS 54 09/23/2021 0824   AST 15 09/23/2021 0824   ALT 19 09/23/2021 0824   BILITOT 0.2 (L) 09/23/2021 0824       Impression and Plan: Felicia Lawrence is a very pleasant 31 yo female with protein S deficiency and history of DVT in the right lower extremity in 2018 and now 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  Xarelto refilled for 90 day supply per patient preference.  Follow-up in 6 months.   Eileen Stanford, NP 6/14/202310:21 AM

## 2022-03-25 LAB — PROTEIN S ACTIVITY: Protein S Activity: 13 % — ABNORMAL LOW (ref 63–140)

## 2022-03-25 LAB — PROTEIN S, TOTAL: Protein S Ag, Total: 47 % — ABNORMAL LOW (ref 60–150)

## 2022-03-29 ENCOUNTER — Telehealth: Payer: Self-pay | Admitting: *Deleted

## 2022-03-29 NOTE — Telephone Encounter (Signed)
Per 03/24/22 los - called and gave upcoming appointments - confirmed 

## 2022-04-01 ENCOUNTER — Other Ambulatory Visit (HOSPITAL_BASED_OUTPATIENT_CLINIC_OR_DEPARTMENT_OTHER): Payer: Self-pay

## 2022-04-08 ENCOUNTER — Encounter: Payer: Self-pay | Admitting: Allergy & Immunology

## 2022-04-08 ENCOUNTER — Other Ambulatory Visit (HOSPITAL_BASED_OUTPATIENT_CLINIC_OR_DEPARTMENT_OTHER): Payer: Self-pay

## 2022-04-08 ENCOUNTER — Ambulatory Visit: Payer: 59 | Admitting: Allergy & Immunology

## 2022-04-08 DIAGNOSIS — L501 Idiopathic urticaria: Secondary | ICD-10-CM

## 2022-04-08 DIAGNOSIS — J3089 Other allergic rhinitis: Secondary | ICD-10-CM

## 2022-04-08 DIAGNOSIS — L508 Other urticaria: Secondary | ICD-10-CM

## 2022-04-08 DIAGNOSIS — J302 Other seasonal allergic rhinitis: Secondary | ICD-10-CM

## 2022-04-08 MED ORDER — OMALIZUMAB 150 MG/ML ~~LOC~~ SOSY
150.0000 mg | PREFILLED_SYRINGE | Freq: Once | SUBCUTANEOUS | Status: AC
  Administered 2022-04-08: 150 mg via SUBCUTANEOUS

## 2022-04-08 MED ORDER — CETIRIZINE HCL 10 MG PO TABS
10.0000 mg | ORAL_TABLET | Freq: Every day | ORAL | 5 refills | Status: AC
  Filled 2022-04-08: qty 100, 100d supply, fill #0

## 2022-04-08 NOTE — Progress Notes (Signed)
NEW PATIENT  Date of Service/Encounter:  04/08/22  Consult requested by: Felicia Richters, PA-C   Assessment:   Chronic urticaria - failed antihistamines, starting Xolair 300mg  monthly today  Seasonal and perennial allergic rhinitis (grasses, ragweed, weeds, indoor molds, outdoor molds, dust mites, and cockroach)  Plan/Recommendations:   1. Chronic urticaria - Your history does not have any "red flags" such as fevers, joint pains, or permanent skin changes that would be concerning for a more serious cause of hives.  - You did have several triggers for urticaria on your testing today. - We can do further lab work-up in the future if needed, but I do not think that is necessary right now. - We are not starting you on a prednisone burst to get symptoms under control. - We are also giving you a sample of Xolair today and Felicia Lawrence will talk you through the approval process. - Chronic hives are often times a self limited process and will "burn themselves out" over 6-12 months, although this is not always the case.  - In the meantime, start suppressive dosing of antihistamines:   - Morning: Allegra (fexofenadine) 180-360mg  (one to two tablets)  - Evening: Zyrtec (cetirizine) 10-20mg  (one to two tablets) - You can change this dosing at home, decreasing the dose as needed or increasing the dosing as needed.   2. Seasonal and perennial allergic rhinitis - Testing today showed: grasses, ragweed, weeds, indoor molds, outdoor molds, dust mites, and cockroach. - Copy of test results provided.  - Avoidance measures provided. - Start taking: antihistamines as above for your hives - You can use an extra dose of the antihistamine, if needed, for breakthrough symptoms.  - Consider nasal saline rinses 1-2 times daily to remove allergens from the nasal cavities as well as help with mucous clearance (this is especially helpful to do before the nasal sprays are given) - I do not think that we need allergy  shots, but we can reconsider in the future if needed.  3. Return in about 2 months (around 06/08/2022).    This note in its entirety was forwarded to the Provider who requested this consultation.  Subjective:   Felicia Lawrence is a 31 y.o. female presenting today for evaluation of  Chief Complaint  Patient presents with   Urticaria    Randomly happens and cause is unknown, controllably itchy without antihistamines    Allergy Testing    Edit Felicia Lawrence has a history of the following: Patient Active Problem List   Diagnosis Date Noted   Seasonal and perennial allergic rhinitis 04/10/2022   Chronic urticaria 04/10/2022    History obtained from: chart review and patient.  Felicia Lawrence was referred by Felicia [place], PA-C.     Felicia Lawrence is a 31 y.o. female presenting for an evaluation of chronic urticaria .  She has had itching and hives since March 2023. At the time, she had recently purchased a new house. She thinks that it might be the carpeting. She has hives and she itches daily. She has tried cetirizine and Claritin and she was prescribed hydroxyzine. She tries to not do the hydroxyzine because it causes sleepiness and she needs to be alert in her job. She has only seen her PCP for these symptoms.   She has had hives on her arms as well as under her neck. She has had these on her stomach and everywhere. They tend to stick around for 10 minutes or so. If she has nothing in her system, they last  a few days. She has not needed prednisone for these symptoms. She has not been to the ED or Urgent Care. It does not seem to be associated with foods at all. No one else in the home has these lesions.   She eats fish and shrimp occasionally, but mostly chicken. She does not drink milk and does not eat eggs. She will do eggs every 4-6 months or so.    Asthma/Respiratory Symptom History: She had asthma as a child but she does not have an inhaler now.   Allergic Rhinitis  Symptom History: She does have some rhinorrhea occasionally. SHe has these symptoms intermittently. It varies a lot.   Otherwise, there is no history of other atopic diseases, including asthma, food allergies, drug allergies, stinging insect allergies, eczema, or contact dermatitis. There is no significant infectious history. Vaccinations are up to date.    Past Medical History: Patient Active Problem List   Diagnosis Date Noted   Seasonal and perennial allergic rhinitis 04/10/2022   Chronic urticaria 04/10/2022    Medication List:  Allergies as of 04/08/2022   No Known Allergies      Medication List        Accurate as of April 08, 2022 11:59 PM. If you have any questions, ask your nurse or doctor.          cetirizine 10 MG tablet Commonly known as: ZYRTEC Take 1 tablet (10 mg total) by mouth daily. Started by: Valentina Shaggy, MD   hydrOXYzine 10 MG tablet Commonly known as: ATARAX Take 1 - 2 tablets by mouth every 8 hours as needed for itching/rash   Loratadine 10 MG Caps Take 10 mg by mouth daily.   rivaroxaban 20 MG Tabs tablet Commonly known as: XARELTO TAKE 1 TABLET BY MOUTH ONCE DAILY WITH SUPPER        Birth History: non-contributory  Developmental History: non-contributory  Past Surgical History: Past Surgical History:  Procedure Laterality Date   TONSILLECTOMY       Family History: Family History  Problem Relation Age of Onset   Clotting disorder Mother    Eczema Sister      Social History: Felicia Lawrence lives at home with her family. She lives in a house that was built in 2022. She has vinyl in the main living areas and carpeting in the bedrooms. There is electric heating and central cooling. There are dust mite coverings on the bedding. There is no tobacco exposure in the home. She works as a Corporate treasurer for the past 5 years.    Review of Systems  Constitutional: Negative.  Negative for chills, fever, malaise/fatigue and weight  loss.  HENT: Negative.  Negative for congestion, ear discharge and ear pain.   Eyes:  Negative for pain, discharge and redness.  Respiratory:  Negative for cough, sputum production, shortness of breath and wheezing.   Cardiovascular: Negative.  Negative for chest pain and palpitations.  Gastrointestinal:  Negative for abdominal pain, constipation, diarrhea, heartburn, nausea and vomiting.  Skin:  Positive for itching and rash.  Neurological:  Negative for dizziness and headaches.  Endo/Heme/Allergies:  Positive for environmental allergies. Does not bruise/bleed easily.       Objective:   Vitals reviewed and all within normal limits.     Physical Exam Constitutional:      Appearance: She is well-developed.  HENT:     Head: Normocephalic and atraumatic.     Right Ear: Tympanic membrane, ear canal and external ear normal. No drainage, swelling  or tenderness. Tympanic membrane is not injected, scarred, erythematous, retracted or bulging.     Left Ear: Tympanic membrane, ear canal and external ear normal. No drainage, swelling or tenderness. Tympanic membrane is not injected, scarred, erythematous, retracted or bulging.     Nose: Mucosal edema and rhinorrhea present. No nasal deformity or septal deviation.     Right Turbinates: Enlarged.     Left Turbinates: Enlarged.     Right Sinus: No maxillary sinus tenderness or frontal sinus tenderness.     Left Sinus: No maxillary sinus tenderness or frontal sinus tenderness.     Mouth/Throat:     Mouth: Mucous membranes are not pale and not dry.     Pharynx: Uvula midline.     Comments: Mild cobblestoning present.  Eyes:     General:        Right eye: No discharge.        Left eye: No discharge.     Conjunctiva/sclera: Conjunctivae normal.     Right eye: Right conjunctiva is not injected. No chemosis.    Left eye: Left conjunctiva is not injected. No chemosis.    Pupils: Pupils are equal, round, and reactive to light.  Cardiovascular:      Rate and Rhythm: Normal rate and regular rhythm.     Heart sounds: Normal heart sounds.  Pulmonary:     Effort: Pulmonary effort is normal. No tachypnea, accessory muscle usage or respiratory distress.     Breath sounds: Normal breath sounds. No wheezing, rhonchi or rales.  Chest:     Chest wall: No tenderness.  Abdominal:     Tenderness: There is no abdominal tenderness. There is no guarding or rebound.  Lymphadenopathy:     Head:     Right side of head: No submandibular, tonsillar or occipital adenopathy.     Left side of head: No submandibular, tonsillar or occipital adenopathy.     Cervical: No cervical adenopathy.  Skin:    General: Skin is warm.     Capillary Refill: Capillary refill takes less than 2 seconds.     Coloration: Skin is not pale.     Findings: Rash present. No abrasion, erythema or petechiae. Rash is urticarial. Rash is not papular or vesicular.     Comments: Multiple urticarial lesions over her arms. Excoriations present over the bilateral arms as well as neck.   Neurological:     Mental Status: She is alert.      Diagnostic studies:   Allergy Studies:     Airborne Adult Perc - 04/08/22 1455     Time Antigen Placed 1430    Allergen Manufacturer Waynette Buttery    Location Back    Number of Test 59    1. Control-Buffer 50% Glycerol Negative    2. Control-Histamine 1 mg/ml 2+    3. Albumin saline Negative    4. Bahia Negative    5. [place] Negative    6. Johnson Negative    7. Kentucky Blue 3+    8. Meadow Fescue Negative    9. Perennial Rye 3+    10. Sweet Vernal Negative    11. Timothy 2+    12. Cocklebur Negative    13. Burweed Marshelder Negative    14. Ragweed, short Negative    15. Ragweed, Giant Negative    16. Plantain,  English Negative    17. Lamb's Quarters Negative    18. Sheep Sorrell Negative    19. Rough Pigweed Negative    20.  Marsh Elder, Rough Negative    21. Mugwort, Common Negative    22. Ash mix Negative    23. Birch mix  Negative    24. Beech American Negative    25. Box, Elder Negative    26. Cedar, red Negative    27. Cottonwood, [place] Negative    28. Elm mix Negative    29. Hickory Negative    30. Maple mix Negative    31. Oak, [place] mix Negative    32. Pecan Pollen Negative    33. Pine mix Negative    34. Sycamore Eastern Negative    35. Cheraw, Black Pollen Negative    36. Alternaria alternata Negative    37. Cladosporium Herbarum Negative    38. Aspergillus mix Negative    39. Penicillium mix Negative    40. Bipolaris sorokiniana (Helminthosporium) Negative    41. Drechslera spicifera (Curvularia) Negative    42. Mucor plumbeus Negative    43. Fusarium moniliforme 2+    44. Aureobasidium pullulans (pullulara) Negative    45. Rhizopus oryzae Negative    46. Botrytis cinera Negative    47. Epicoccum nigrum Negative    48. Phoma betae Negative    49. Candida Albicans Negative    50. Trichophyton mentagrophytes Negative    51. Mite, D Farinae  5,000 AU/ml Negative    52. Mite, D Pteronyssinus  5,000 AU/ml 2+    53. Cat Hair 10,000 BAU/ml Negative    54.  Dog Epithelia Negative    55. Mixed Feathers Negative    56. Horse Epithelia Negative    57. Cockroach, German Negative    58. Mouse Negative    59. Tobacco Leaf Negative             Food Perc - 04/08/22 1455       Test Information   Time Antigen Placed 1430    Allergen Manufacturer Lavella Hammock    Location Back    Number of allergen test 10      Food   1. Peanut Negative    2. Soybean food Negative    3. Wheat, whole Negative    4. Sesame Negative    5. Milk, cow Negative    6. Egg White, chicken Negative    7. Casein Negative    8. Shellfish mix Negative    9. Fish mix Negative    10. Cashew Negative             Intradermal - 04/08/22 1508     Time Antigen Placed 1515    Allergen Manufacturer Lavella Hammock    Location Arm    Number of Test 12    Intradermal Select    Control Negative    [place] 3+    Johnson  Negative    Ragweed mix 1+    Weed mix 2+    Tree mix Negative    Mold 1 3+    Mold 2 Negative    Mold 3 Negative    Cat Negative    Dog Negative    Cockroach 2+             Allergy testing results were read and interpreted by myself, documented by clinical staff.         Salvatore Marvel, MD [facility]

## 2022-04-08 NOTE — Patient Instructions (Addendum)
1. Chronic urticaria - Your history does not have any "red flags" such as fevers, joint pains, or permanent skin changes that would be concerning for a more serious cause of hives.  - You did have several triggers for urticaria on your testing today. - We can do further lab work-up in the future if needed, but I do not think that is necessary right now. - We are not starting you on a prednisone burst to get symptoms under control. - We are also giving you a sample of Xolair today and Tammy will talk you through the approval process. - Chronic hives are often times a self limited process and will "burn themselves out" over 6-12 months, although this is not always the case.  - In the meantime, start suppressive dosing of antihistamines:   - Morning: Allegra (fexofenadine) 180-360mg  (one to two tablets)  - Evening: Zyrtec (cetirizine) 10-20mg  (one to two tablets) - You can change this dosing at home, decreasing the dose as needed or increasing the dosing as needed.   2. Seasonal and perennial allergic rhinitis - Testing today showed: grasses, ragweed, weeds, indoor molds, outdoor molds, dust mites, and cockroach. - Copy of test results provided.  - Avoidance measures provided. - Start taking: antihistamines as above for your hives - You can use an extra dose of the antihistamine, if needed, for breakthrough symptoms.  - Consider nasal saline rinses 1-2 times daily to remove allergens from the nasal cavities as well as help with mucous clearance (this is especially helpful to do before the nasal sprays are given) - I do not think that we need allergy shots, but we can reconsider in the future if needed.  3. Return in about 2 months (around 06/08/2022).    Please inform us of any Emergency Department visits, hospitalizations, or changes in symptoms. Call us before going to the ED for breathing or allergy symptoms since we might be able to fit you in for a sick visit. Feel free to contact us anytime  with any questions, problems, or concerns.  It was a pleasure to meet you today!  Websites that have reliable patient information: 1. American Academy of Asthma, Allergy, and Immunology: www.aaaai.org 2. Food Allergy Research and Education (FARE): foodallergy.org 3. Mothers of Asthmatics: http://www.asthmacommunitynetwork.org 4. American College of Allergy, Asthma, and Immunology: www.acaai.org   COVID-19 Vaccine Information can be found at: [email or web address] For questions related to vaccine distribution or appointments, please email [email or web address] or call [phone].   We realize that you might be concerned about having an allergic reaction to the COVID19 vaccines. To help with that concern, WE ARE OFFERING THE COVID19 VACCINES IN OUR OFFICE! Ask the front desk for dates!     "Like" [place] on Facebook and Instagram for our latest updates!      A healthy democracy works best when [employer] participate! Make sure you are registered to vote! If you have moved or changed any of your contact information, you will need to get this updated before voting!  In some cases, you MAY be able to register to vote online: [email or web address]     Airborne Adult Perc - 04/08/22 1455     Time Antigen Placed 1430    Allergen Manufacturer Waynette Buttery    Location Back    Number of Test 59    1. Control-Buffer 50% Glycerol Negative    2. Control-Histamine 1 mg/ml 2+    3. Albumin saline Negative    4. Bahia Negative    5. [place]  Negative    6. Johnson Negative    7. [place] Blue 3+    8. Meadow Fescue Negative    9. Perennial Rye 3+    10. Sweet Vernal Negative    11. Timothy 2+    12. Cocklebur Negative    13. Burweed Marshelder Negative    14. Ragweed, short Negative    15. Ragweed, Giant Negative    16. Plantain,  English Negative    17. Lamb's Quarters Negative    18. Sheep Sorrell Negative     19. Rough Pigweed Negative    20. Marsh Elder, Rough Negative    21. Mugwort, Common Negative    22. Ash mix Negative    23. Birch mix Negative    24. Beech American Negative    25. Box, Elder Negative    26. Cedar, red Negative    27. Cottonwood, [place] Negative    28. Elm mix Negative    29. Hickory Negative    30. Maple mix Negative    31. Oak, [place] mix Negative    32. Pecan Pollen Negative    33. Pine mix Negative    34. Sycamore Eastern Negative    35. Walnut, Black Pollen Negative    36. Alternaria alternata Negative    37. Cladosporium Herbarum Negative    38. Aspergillus mix Negative    39. Penicillium mix Negative    40. Bipolaris sorokiniana (Helminthosporium) Negative    41. Drechslera spicifera (Curvularia) Negative    42. Mucor plumbeus Negative    43. Fusarium moniliforme 2+    44. Aureobasidium pullulans (pullulara) Negative    45. Rhizopus oryzae Negative    46. Botrytis cinera Negative    47. Epicoccum nigrum Negative    48. Phoma betae Negative    49. Candida Albicans Negative    50. Trichophyton mentagrophytes Negative    51. Mite, D Farinae  5,000 AU/ml Negative    52. Mite, D Pteronyssinus  5,000 AU/ml 2+    53. Cat Hair 10,000 BAU/ml Negative    54.  Dog Epithelia Negative    55. Mixed Feathers Negative    56. Horse Epithelia Negative    57. Cockroach, German Negative    58. Mouse Negative    59. Tobacco Leaf Negative             Food Perc - 04/08/22 1455       Test Information   Time Antigen Placed 1430    Allergen Manufacturer Waynette Buttery    Location Back    Number of allergen test 10      Food   1. Peanut Negative    2. Soybean food Negative    3. Wheat, whole Negative    4. Sesame Negative    5. Milk, cow Negative    6. Egg White, chicken Negative    7. Casein Negative    8. Shellfish mix Negative    9. Fish mix Negative    10. Cashew Negative             Intradermal - 04/08/22 1508     Time Antigen Placed 1515     Allergen Manufacturer Waynette Buttery    Location Arm    Number of Test 12    Intradermal Select    Control Negative    [place] 3+    Johnson Negative    Ragweed mix 1+    Weed mix 2+    Tree mix Negative    Mold  1 3+    Mold 2 Negative    Mold 3 Negative    Cat Negative    Dog Negative    Cockroach 2+             Reducing Pollen Exposure  The American Academy of Allergy, Asthma and Immunology suggests the following steps to reduce your exposure to pollen during allergy seasons.    Do not hang sheets or clothing out to dry; pollen may collect on these items. Do not mow lawns or spend time around freshly cut grass; mowing stirs up pollen. Keep windows closed at night.  Keep car windows closed while driving. Minimize morning activities outdoors, a time when pollen counts are usually at their highest. Stay indoors as much as possible when pollen counts or humidity is high and on windy days when pollen tends to remain in the air longer. Use air conditioning when possible.  Many air conditioners have filters that trap the pollen spores. Use a HEPA room air filter to remove pollen form the indoor air you breathe.  Control of Mold Allergen   Mold and fungi can grow on a variety of surfaces provided certain temperature and moisture conditions exist.  Outdoor molds grow on plants, decaying vegetation and soil.  The major outdoor mold, Alternaria and Cladosporium, are found in very high numbers during hot and dry conditions.  Generally, a late Summer - Fall peak is seen for common outdoor fungal spores.  Rain will temporarily lower outdoor mold spore count, but counts rise rapidly when the rainy period ends.  The most important indoor molds are Aspergillus and Penicillium.  Dark, humid and poorly ventilated basements are ideal sites for mold growth.  The next most common sites of mold growth are the bathroom and the kitchen.  Outdoor (Seasonal) Mold Control  Positive outdoor molds via skin  testing: Alternaria and Cladosporium  Use air conditioning and keep windows closed Avoid exposure to decaying vegetation. Avoid leaf raking. Avoid grain handling. Consider wearing a face mask if working in moldy areas.    Indoor (Perennial) Mold Control   Positive indoor molds via skin testing: Fusarium  Maintain humidity below 50%. Clean washable surfaces with 5% bleach solution. Remove sources e.g. contaminated carpets.    Control of Dust Mite Allergen    Dust mites play a major role in allergic asthma and rhinitis.  They occur in environments with high humidity wherever human skin is found.  Dust mites absorb humidity from the atmosphere (ie, they do not drink) and feed on organic matter (including shed human and animal skin).  Dust mites are a microscopic type of insect that you cannot see with the naked eye.  High levels of dust mites have been detected from mattresses, pillows, carpets, upholstered furniture, bed covers, clothes, soft toys and any woven material.  The principal allergen of the dust mite is found in its feces.  A gram of dust may contain 1,000 mites and 250,000 fecal particles.  Mite antigen is easily measured in the air during house cleaning activities.  Dust mites do not bite and do not cause harm to humans, other than by triggering allergies/asthma.    Ways to decrease your exposure to dust mites in your home:  Encase mattresses, box springs and pillows with a mite-impermeable barrier or cover   Wash sheets, blankets and drapes weekly in hot water (130 F) with detergent and dry them in a dryer on the hot setting.  Have the room cleaned frequently with  a vacuum cleaner and a damp dust-mop.  For carpeting or rugs, vacuuming with a vacuum cleaner equipped with a high-efficiency particulate air (HEPA) filter.  The dust mite allergic individual should not be in a room which is being cleaned and should wait 1 hour after cleaning before going into the room. Do not  sleep on upholstered furniture (eg, couches).   If possible removing carpeting, upholstered furniture and drapery from the home is ideal.  Horizontal blinds should be eliminated in the rooms where the person spends the most time (bedroom, study, television room).  Washable vinyl, roller-type shades are optimal. Remove all non-washable stuffed toys from the bedroom.  Wash stuffed toys weekly like sheets and blankets above.   Reduce indoor humidity to less than 50%.  Inexpensive humidity monitors can be purchased at most hardware stores.  Do not use a humidifier as can make the problem worse and are not recommended.  Control of Cockroach Allergen  Cockroach allergen has been identified as an important cause of acute attacks of asthma, especially in urban settings.  There are fifty-five species of cockroach that exist in the [place], however only three, the [place], [place] species produce allergen that can affect patients with Asthma.  Allergens can be obtained from fecal particles, egg casings and secretions from cockroaches.    Remove food sources. Reduce access to water. Seal access and entry points. Spray runways with 0.5-1% Diazinon or Chlorpyrifos Blow boric acid power under stoves and refrigerator. Place bait stations (hydramethylnon) at feeding sites.

## 2022-04-10 DIAGNOSIS — J302 Other seasonal allergic rhinitis: Secondary | ICD-10-CM | POA: Insufficient documentation

## 2022-04-10 DIAGNOSIS — L508 Other urticaria: Secondary | ICD-10-CM | POA: Insufficient documentation

## 2022-04-14 ENCOUNTER — Telehealth: Payer: Self-pay | Admitting: *Deleted

## 2022-04-14 NOTE — Telephone Encounter (Signed)
T/C to patient advised approval, copay card and submit Xolair to [facility]

## 2022-04-20 NOTE — Telephone Encounter (Signed)
Great - thanks!   Rozella Servello, MD [facility]    

## 2022-05-07 MED ORDER — OMALIZUMAB 150 MG/ML ~~LOC~~ SOSY
300.0000 mg | PREFILLED_SYRINGE | SUBCUTANEOUS | Status: AC
  Administered 2022-04-08 – 2024-10-24 (×34): 300 mg via SUBCUTANEOUS

## 2022-05-07 NOTE — Addendum Note (Signed)
Addended by: Devoria Glassing on: 05/07/2022 02:15 PM   Modules accepted: Orders

## 2022-05-14 ENCOUNTER — Ambulatory Visit (INDEPENDENT_AMBULATORY_CARE_PROVIDER_SITE_OTHER): Payer: 59

## 2022-05-14 ENCOUNTER — Other Ambulatory Visit (HOSPITAL_BASED_OUTPATIENT_CLINIC_OR_DEPARTMENT_OTHER): Payer: Self-pay

## 2022-05-14 DIAGNOSIS — L501 Idiopathic urticaria: Secondary | ICD-10-CM | POA: Diagnosis not present

## 2022-05-30 ENCOUNTER — Ambulatory Visit
Admission: RE | Admit: 2022-05-30 | Discharge: 2022-05-30 | Disposition: A | Discharge status: Home / Self Care | Payer: 59 | Source: Ambulatory Visit | Attending: Emergency Medicine | Admitting: Emergency Medicine

## 2022-05-30 VITALS — BP 119/84 | HR 80 | Temp 98.4°F | Resp 18

## 2022-05-30 DIAGNOSIS — N898 Other specified noninflammatory disorders of vagina: Secondary | ICD-10-CM | POA: Insufficient documentation

## 2022-05-30 MED ORDER — METRONIDAZOLE 500 MG PO TABS: 500.0000 mg | ORAL_TABLET | Freq: Two times a day (BID) | ORAL | 0 refills | Status: AC

## 2022-05-30 NOTE — ED Triage Notes (Signed)
Pt presents with abnormal vaginal discharge over the past few days.

## 2022-05-30 NOTE — ED Provider Notes (Signed)
HPI  SUBJECTIVE:  Felicia Lawrence is a 31 y.o. female who presents 2 to 3 days of scant nonodorous vaginal discharge.  No vaginal bleeding, itching, genital rash, vomiting, fevers, abdominal, back, pelvic pain.  No urinary complaints.  She is in a long-term monogamous relationship with a female, who is asymptomatic.  STDs are not a concern today, but she is fine with [place] testing for STDs.  No recent antibiotics.  She has not tried anything for symptoms.  There are no aggravating or alleviating factors.  She has a past medical history of frequent BV, yeast infections.  She is prescribed boric acid suppositories, but has not used them in a month, because she has not had any symptoms.  She also has a past medical history of DVT on Xarelto.  No history of STDs, diabetes.  LMP: 7/27.  Denies the possibility of being pregnant.  PCP: [facility]N Infertility .     Past Medical History:  Diagnosis Date   Asthma    only as a child.   DVT (deep venous thrombosis) (HCC)    Urticaria     Past Surgical History:  Procedure Laterality Date   TONSILLECTOMY      Family History  Problem Relation Age of Onset   Clotting disorder Mother    Eczema Sister     Social History   Tobacco Use   Smoking status: Never    Passive exposure: Never   Smokeless tobacco: Never  Vaping Use   Vaping Use: Never used  Substance Use Topics   Alcohol use: No   Drug use: No     Current Facility-Administered Medications:    omalizumab Geoffry Paradise) prefilled syringe 300 mg, 300 mg, Subcutaneous, Q28 days, Alfonse Spruce, MD, 300 mg at 05/14/22 1131  Current Outpatient Medications:    metroNIDAZOLE (FLAGYL) 500 MG tablet, Take 1 tablet (500 mg total) by mouth 2 (two) times daily for 7 days., Disp: 14 tablet, Rfl: 0   cetirizine (ZYRTEC) 10 MG tablet, Take 1 tablet (10 mg total) by mouth daily., Disp: 100 tablet, Rfl: 5   hydrOXYzine (ATARAX) 10 MG tablet, Take 1 - 2 tablets by mouth every 8 hours as needed for  itching/rash, Disp: 30 tablet, Rfl: 0   Loratadine 10 MG CAPS, Take 10 mg by mouth daily., Disp: , Rfl:    rivaroxaban (XARELTO) 20 MG TABS tablet, TAKE 1 TABLET BY MOUTH ONCE DAILY WITH SUPPER, Disp: 90 tablet, Rfl: 2  No Known Allergies   ROS  As noted in HPI.   Physical Exam  BP 119/84 (BP Location: Left Arm)   Pulse 80   Temp 98.4 F (36.9 C) (Oral)   Resp 18   LMP 05/06/2022   SpO2 97%   Constitutional: Well developed, well nourished, no acute distress Eyes:  EOMI, conjunctiva normal bilaterally HENT: Normocephalic, atraumatic,mucus membranes moist Respiratory: Normal inspiratory effort Cardiovascular: Normal rate GI: nondistended soft, nontender. No suprapubic or lower quadrant tenderness  back: No CVA tenderness GU: Deferred-did self swab  skin: No rash, skin intact Musculoskeletal: no deformities Neurologic: Alert & oriented x 3, no focal neuro deficits Psychiatric: Speech and behavior appropriate   ED Course   Medications - No data to display  No orders of the defined types were placed in this encounter.   No results found for this or any previous visit (from the past 24 hour(s)). No results found.  ED Clinical Impression  1. Vaginal discharge     ED Assessment/Plan  Previous records reviewed.  She has had BV and yeast in the past.  She has tested negative for STDs multiple times last year.  H&P most c/w BV. Sent off Aptima swab.  Discussed waiting for lab results versus starting Flagyl today, patient would like to start Flagyl today . Advised pt to refrain from sexual contact until she knows lab results, symptoms resolve, and partner(s) are treated if necessary. Pt provided working phone number, and she has Clinical cytogeneticist. Follow-up with PCP as needed. Discussed labs, MDM, plan and followup with patient. Pt agrees with plan.   Meds ordered this encounter  Medications   metroNIDAZOLE (FLAGYL) 500 MG tablet    Sig: Take 1 tablet (500 mg total) by mouth 2  (two) times daily for 7 days.    Dispense:  14 tablet    Refill:  0    *This clinic note was created using Scientist, clinical (histocompatibility and immunogenetics). Therefore, there may be occasional mistakes despite careful proofreading.  ?     Domenick Gong, MD 05/30/22 [phone]

## 2022-05-30 NOTE — Discharge Instructions (Addendum)
Take the medication as written. Give [place] a working phone number so that we can contact you if needed. Refrain from sexual contact until all of your labs have come back, symptoms have resolved, and your partner(s) are treated if necessary. Return to the ER if you get worse, have a fever >100.4, or for any concerns.  Do not drink alcohol while taking this medication  Go to www.goodrx.com  or www.costplusdrugs.com to look up your medications. This will give you a list of where you can find your prescriptions at the most affordable prices. Or ask the pharmacist what the cash price is, or if they have any other discount programs available to help make your medication more affordable. This can be less expensive than what you would pay with insurance.

## 2022-05-31 LAB — CERVICOVAGINAL ANCILLARY ONLY
Bacterial Vaginitis (gardnerella): NEGATIVE
Candida Glabrata: NEGATIVE
Candida Vaginitis: POSITIVE — AB
Chlamydia: NEGATIVE
Comment: NEGATIVE
Comment: NEGATIVE
Comment: NEGATIVE
Comment: NEGATIVE
Comment: NEGATIVE
Comment: NORMAL
Neisseria Gonorrhea: NEGATIVE
Trichomonas: NEGATIVE

## 2022-06-01 ENCOUNTER — Telehealth (HOSPITAL_COMMUNITY): Payer: Self-pay | Admitting: Emergency Medicine

## 2022-06-01 MED ORDER — FLUCONAZOLE 150 MG PO TABS: 150.0000 mg | ORAL_TABLET | Freq: Once | ORAL | 0 refills | Status: AC

## 2022-06-10 ENCOUNTER — Ambulatory Visit (INDEPENDENT_AMBULATORY_CARE_PROVIDER_SITE_OTHER): Payer: 59

## 2022-06-10 DIAGNOSIS — L501 Idiopathic urticaria: Secondary | ICD-10-CM

## 2022-06-11 ENCOUNTER — Ambulatory Visit: Payer: 59 | Admitting: Medical

## 2022-06-11 ENCOUNTER — Other Ambulatory Visit (HOSPITAL_BASED_OUTPATIENT_CLINIC_OR_DEPARTMENT_OTHER): Payer: Self-pay

## 2022-06-11 VITALS — BP 112/68 | HR 86 | Resp 18 | Ht 63.0 in | Wt 204.0 lb

## 2022-06-11 DIAGNOSIS — L309 Dermatitis, unspecified: Secondary | ICD-10-CM

## 2022-06-11 MED ORDER — TRIAMCINOLONE ACETONIDE 0.025 % EX OINT
1.0000 | TOPICAL_OINTMENT | Freq: Two times a day (BID) | CUTANEOUS | 0 refills | Status: AC
  Filled 2022-06-11: qty 30, 15d supply, fill #0

## 2022-06-11 NOTE — Patient Instructions (Addendum)
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 Recommend cutting back on hand washing and use gloves at work when you need to. Keep hand moisturized, use dove moisturizing soap and triamcinolone ointment twice a day if needed.  If area worsen or change notify us.  Follow up for wellness exam recommended or sooner for hands if needed.   Eczema Eczema refers to a group of skin conditions that cause skin to become rough and inflamed. Each type of eczema has different triggers, symptoms, and treatments. Eczema of any type is usually itchy. Symptoms range from mild to severe. Eczema is not spread from person to person (is not contagious). It can appear on different parts of the body at different times. One person's eczema may look different from another person's eczema. What are the causes? The exact cause of this condition is not known. However, exposure to certain environmental factors, irritants, and allergens can make the condition worse. What are the signs or symptoms? Symptoms of this condition depend on the type of eczema you have. The types include: Contact dermatitis. There are two kinds: Irritant contact dermatitis. This happens when something irritates the skin and causes a rash. Allergic contact dermatitis. This happens when your skin comes in contact with something you are allergic to (allergens). This can include poison ivy, chemicals, or medicines that were applied to your skin. Atopic dermatitis. This is a long-term (chronic) skin disease that keeps coming back (recurring). It is the most common type of eczema. Usual symptoms are a red rash and itchy, dry, scaly skin. It usually starts showing signs in infancy and can last through adulthood. Dyshidrotic eczema. This is a form of eczema on the hands and feet. It shows up as very itchy, fluid-filled blisters. It can affect people of any age but is more common before age 70. Hand eczema. This causes very itchy areas of skin on the palms and  sides of the hands and fingers. This type of eczema is common in industrial jobs where you may be exposed to different types of irritants. Lichen simplex chronicus. This type of eczema occurs when a person constantly scratches one area of the body. Repeated scratching of the area leads to thickened skin (lichenification). This condition can accompany other types of eczema. It is more common in adults but may also be seen in children. Nummular eczema. This is a common type of eczema that most often affects the lower legs and the backs of the hands. It typically causes an itchy, red, circular, crusty lesion (plaque). Scratching may become a habit and can cause bleeding. Nummular eczema occurs most often in middle-aged or older people. Seborrheic dermatitis. This is a common skin disease that mainly affects the scalp. It may also affect other oily areas of the body, such as the face, sides of the nose, eyebrows, ears, eyelids, and chest. It is marked by small scaling and redness of the skin (erythema). This can affect people of all ages. In infants, this condition is called cradle cap. Stasis dermatitis. This is a common skin disease that can cause itching, scaling, and hyperpigmentation, usually on the legs and feet. It occurs most often in people who have a condition that prevents blood from being pumped through the veins in the legs (chronic venous insufficiency). Stasis dermatitis is a chronic condition that needs long-term management. How is this diagnosed? This condition may be diagnosed based on: A physical exam of your skin. Your medical history. Skin patch tests. These tests involve using patches  that contain possible allergens and placing them on your back. Your health care provider will check in a few days to see if an allergic reaction occurred. How is this treated? Treatment for eczema is based on the type of eczema you have. You may be given hydrocortisone steroid medicine or antihistamines.  These can relieve itching quickly and help reduce inflammation. These may be prescribed or purchased over the counter, depending on the strength that is needed. Follow these instructions at home: Take or apply over-the-counter and prescription medicines only as told by your health care provider. Use creams or ointments to moisturize your skin. Do not use lotions. Learn what triggers or irritates your symptoms so you can avoid these things. Treat symptom flare-ups quickly. Do not scratch your skin. This can make your rash worse. Keep all follow-up visits. This is important. Where to find more information American Academy of Dermatology: MarketingSheets.si National Eczema Association: nationaleczema.org The Society for Pediatric Dermatology: pedsderm.net Contact a health care provider if: You have severe itching, even with treatment. You scratch your skin regularly until it bleeds. Your rash looks different than usual. Your skin is painful, swollen, or more red than usual. You have a fever. Summary Eczema refers to a group of skin conditions that cause skin to become rough and inflamed. Each type has different triggers. Eczema of any type causes itching that may range from mild to severe. Treatment varies based on the type of eczema you have. Hydrocortisone steroid medicine or antihistamines can help with itching and inflammation. Protecting your skin is the best way to prevent eczema. Use creams or ointments to moisturize your skin. Avoid triggers and irritants. Treat flare-ups quickly. This information is not intended to replace advice given to you by your health care provider. Make sure you discuss any questions you have with your health care provider. Document Revised: 07/07/2020 Document Reviewed: 07/07/2020 Elsevier Patient Education  2023 [employer].

## 2022-06-11 NOTE — Progress Notes (Signed)
Subjective:    Patient ID: Felicia Lawrence, female    DOB: 06-27-1991, 31 y.o.   MRN: [id number]  HPI  Pt in for intermittent mild bump and rash to her hands. She state since march. This summer more prominent. Pt look on line and though may have had eczema. 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 Washes hand at home and washes some dishes at time.  Pt states after she washes hand rash more prominent.  When moisturized hands area is better.  Review of Systems  Constitutional:  Negative for chills, fatigue and fever.  Respiratory:  Negative for cough, choking, chest tightness, shortness of breath and wheezing.   Cardiovascular:  Negative for chest pain and palpitations.  Gastrointestinal:  Negative for abdominal pain and constipation.  Musculoskeletal:  Negative for back pain, joint swelling, myalgias and neck stiffness.  Skin:  Negative for rash.       Rash.   Past Medical History:  Diagnosis Date   Asthma    only as a child.   DVT (deep venous thrombosis) (HCC)    Urticaria      Social History   Socioeconomic History   Marital status: Single    Spouse name: Not on file   Number of children: Not on file   Years of education: Not on file   Highest education level: Not on file  Occupational History   Not on file  Tobacco Use   Smoking status: Never    Passive exposure: Never   Smokeless tobacco: Never  Vaping Use   Vaping Use: Never used  Substance and Sexual Activity   Alcohol use: No   Drug use: No   Sexual activity: Yes    Birth control/protection: None  Other Topics Concern   Not on file  Social History Narrative   Not on file   Social Determinants of Health   Financial Resource Strain: Not on file  Food Insecurity: Not on file  Transportation Needs: Not on file  Physical Activity: Not on file  Stress: Not on file  Social Connections: Not on file  Intimate Partner Violence: Not on file    Past Surgical History:  Procedure  Laterality Date   TONSILLECTOMY      Family History  Problem Relation Age of Onset   Clotting disorder Mother    Eczema Sister     No Known Allergies  Current Outpatient Medications on File Prior to Visit  Medication Sig Dispense Refill   cetirizine (ZYRTEC) 10 MG tablet Take 1 tablet (10 mg total) by mouth daily. 100 tablet 5   hydrOXYzine (ATARAX) 10 MG tablet Take 1 - 2 tablets by mouth every 8 hours as needed for itching/rash 30 tablet 0   Loratadine 10 MG CAPS Take 10 mg by mouth daily.     rivaroxaban (XARELTO) 20 MG TABS tablet TAKE 1 TABLET BY MOUTH ONCE DAILY WITH SUPPER 90 tablet 2   Current Facility-Administered Medications on File Prior to Visit  Medication Dose Route Frequency Provider Last Rate Last Admin   omalizumab Geoffry Paradise) prefilled syringe 300 mg  300 mg Subcutaneous Q28 days Alfonse Spruce, MD   300 mg at 06/10/22 1415    BP 112/68   Pulse 86   Resp 18   Ht 5\' 3"  (1.6 m)   Wt 204 lb (92.5 kg)   LMP 06/01/2022   SpO2 100%   BMI 36.14 kg/m        Objective:  Physical Exam  General- no acute distress. Hands- no obvious rash presently. Except left 3rd digit.       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 Recommend cutting back on hand washing and use gloves at work when you need to. Keep hand moisturized, use dove moisturizing soap and triamcinolone ointment twice a day if needed.  If area worsen or change notify us.  Follow up for wellness exam recommended or sooner for hands if needed.  Esperanza Richters, PA-C

## 2022-07-07 ENCOUNTER — Ambulatory Visit (INDEPENDENT_AMBULATORY_CARE_PROVIDER_SITE_OTHER): Payer: 59 | Admitting: *Deleted

## 2022-07-07 DIAGNOSIS — L501 Idiopathic urticaria: Secondary | ICD-10-CM

## 2022-07-27 ENCOUNTER — Other Ambulatory Visit (HOSPITAL_BASED_OUTPATIENT_CLINIC_OR_DEPARTMENT_OTHER): Payer: Self-pay

## 2022-08-04 ENCOUNTER — Ambulatory Visit (INDEPENDENT_AMBULATORY_CARE_PROVIDER_SITE_OTHER): Payer: 59 | Admitting: *Deleted

## 2022-08-04 DIAGNOSIS — L501 Idiopathic urticaria: Secondary | ICD-10-CM

## 2022-09-01 ENCOUNTER — Ambulatory Visit (INDEPENDENT_AMBULATORY_CARE_PROVIDER_SITE_OTHER): Payer: 59 | Admitting: *Deleted

## 2022-09-01 DIAGNOSIS — L501 Idiopathic urticaria: Secondary | ICD-10-CM

## 2022-09-24 ENCOUNTER — Encounter: Payer: Self-pay | Admitting: Family

## 2022-09-24 ENCOUNTER — Inpatient Hospital Stay (HOSPITAL_BASED_OUTPATIENT_CLINIC_OR_DEPARTMENT_OTHER): Payer: 59 | Admitting: Family

## 2022-09-24 ENCOUNTER — Other Ambulatory Visit (HOSPITAL_BASED_OUTPATIENT_CLINIC_OR_DEPARTMENT_OTHER): Payer: Self-pay

## 2022-09-24 ENCOUNTER — Other Ambulatory Visit: Payer: Self-pay

## 2022-09-24 ENCOUNTER — Inpatient Hospital Stay: Payer: 59 | Attending: Hematology & Oncology

## 2022-09-24 VITALS — BP 100/68 | HR 79 | Temp 98.3°F | Resp 18 | Ht 63.0 in | Wt 204.0 lb

## 2022-09-24 DIAGNOSIS — I82402 Acute embolism and thrombosis of unspecified deep veins of left lower extremity: Secondary | ICD-10-CM

## 2022-09-24 DIAGNOSIS — I82562 Chronic embolism and thrombosis of left calf muscular vein: Secondary | ICD-10-CM | POA: Insufficient documentation

## 2022-09-24 DIAGNOSIS — D6859 Other primary thrombophilia: Secondary | ICD-10-CM | POA: Diagnosis not present

## 2022-09-24 DIAGNOSIS — Z86718 Personal history of other venous thrombosis and embolism: Secondary | ICD-10-CM | POA: Diagnosis not present

## 2022-09-24 DIAGNOSIS — Z7901 Long term (current) use of anticoagulants: Secondary | ICD-10-CM | POA: Insufficient documentation

## 2022-09-24 LAB — CBC WITH DIFFERENTIAL (CANCER CENTER ONLY)
Abs Immature Granulocytes: 0 10*3/uL (ref 0.00–0.07)
Basophils Absolute: 0 10*3/uL (ref 0.0–0.1)
Basophils Relative: 1 %
Eosinophils Absolute: 0.1 10*3/uL (ref 0.0–0.5)
Eosinophils Relative: 2 %
HCT: 39.1 % (ref 36.0–46.0)
Hemoglobin: 12.6 g/dL (ref 12.0–15.0)
Immature Granulocytes: 0 %
Lymphocytes Relative: 29 %
Lymphs Abs: 1.4 10*3/uL (ref 0.7–4.0)
MCH: 28.4 pg (ref 26.0–34.0)
MCHC: 32.2 g/dL (ref 30.0–36.0)
MCV: 88.3 fL (ref 80.0–100.0)
Monocytes Absolute: 0.5 10*3/uL (ref 0.1–1.0)
Monocytes Relative: 10 %
Neutro Abs: 2.9 10*3/uL (ref 1.7–7.7)
Neutrophils Relative %: 58 %
Platelet Count: 270 10*3/uL (ref 150–400)
RBC: 4.43 MIL/uL (ref 3.87–5.11)
RDW: 11.9 % (ref 11.5–15.5)
WBC Count: 4.8 10*3/uL (ref 4.0–10.5)
nRBC: 0 % (ref 0.0–0.2)

## 2022-09-24 LAB — CMP (CANCER CENTER ONLY)
ALT: 14 U/L (ref 0–44)
AST: 15 U/L (ref 15–41)
Albumin: 4.5 g/dL (ref 3.5–5.0)
Alkaline Phosphatase: 50 U/L (ref 38–126)
Anion gap: 8 (ref 5–15)
BUN: 16 mg/dL (ref 6–20)
CO2: 27 mmol/L (ref 22–32)
Calcium: 9.3 mg/dL (ref 8.9–10.3)
Chloride: 105 mmol/L (ref 98–111)
Creatinine: 0.66 mg/dL (ref 0.44–1.00)
GFR, Estimated: >60 mL/min (ref >60)
Glucose, Bld: 98 mg/dL (ref 70–99)
Potassium: 3.6 mmol/L (ref 3.5–5.1)
Sodium: 140 mmol/L (ref 135–145)
Total Bilirubin: 0.3 mg/dL (ref 0.3–1.2)
Total Protein: 7.1 g/dL (ref 6.5–8.1)

## 2022-09-24 LAB — LACTATE DEHYDROGENASE: LDH: 116 U/L (ref 98–192)

## 2022-09-24 MED ORDER — RIVAROXABAN 20 MG PO TABS
ORAL_TABLET | Freq: Every day | ORAL | 6 refills | Status: DC
  Filled 2022-09-24 – 2022-10-15 (×2): qty 30, 30d supply, fill #0
  Filled 2022-12-13: qty 30, 30d supply, fill #1
  Filled 2023-02-11: qty 30, 30d supply, fill #2
  Filled 2023-03-18: qty 30, 30d supply, fill #3
  Filled 2023-06-29: qty 30, 30d supply, fill #4
  Filled 2023-08-10: qty 30, 30d supply, fill #5
  Filled 2023-09-16: qty 30, 30d supply, fill #6

## 2022-09-24 NOTE — Progress Notes (Signed)
Hematology and Oncology Follow Up Visit  Ellise Kovack [id number] 07/06/1991 31 y.o. 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 Rettke is here today for follow-up. She is doing quite well and has no complaints at this time.  She is staying busy with work.  She has been taking her Xarelto daily as prescribed.  She denies any blood loss, abnormal bruising or petechiae.  No fever, chills, n/v, cough, rash, dizziness, SOB, chest pain, palpitations, abdominal pain or changes in bowel or bladder habits.  No swelling, tenderness, numbness or tingling in her extremities.  No falls or syncope.  Appetite and hydration are good. Her weight is stable at 204 lbs.   ECOG Performance Status: 1 - Symptomatic but completely ambulatory  Medications:  Allergies as of 09/24/2022   No Known Allergies      Medication List        Accurate as of September 24, 2022 10:19 AM. If you have any questions, ask your nurse or doctor.          cetirizine 10 MG tablet Commonly known as: ZYRTEC Take 1 tablet (10 mg total) by mouth daily.   hydrOXYzine 10 MG tablet Commonly known as: ATARAX Take 1 - 2 tablets by mouth every 8 hours as needed for itching/rash   Loratadine 10 MG Caps Take 10 mg by mouth daily.   triamcinolone 0.025 % ointment Commonly known as: KENALOG Apply 1 Application topically 2 (two) times daily.   Xarelto 20 MG Tabs tablet Generic drug: rivaroxaban TAKE 1 TABLET BY MOUTH ONCE DAILY WITH SUPPER        Allergies: No Known Allergies  Past Medical History, Surgical history, Social history, and Family History were reviewed and updated.  Review of Systems: All other 10 point review of systems is negative.   Physical Exam:  vitals were not taken for this  visit.   Wt Readings from Last 3 Encounters:  06/11/22 204 lb (92.5 kg)  03/24/22 201 lb (91.2 kg)  03/11/22 200 lb (90.7 kg)    Ocular: Sclerae unicteric, pupils equal, round and reactive to light Ear-nose-throat: Oropharynx clear, dentition fair Lymphatic: No cervical or supraclavicular adenopathy Lungs no rales or rhonchi, good excursion bilaterally Heart regular rate and rhythm, no murmur appreciated Abd soft, nontender, positive bowel sounds MSK no focal spinal tenderness, no joint edema Neuro: non-focal, well-oriented, appropriate affect Breasts: Deferred   Lab Results  Component Value Date   WBC 4.8 09/24/2022   HGB 12.6 09/24/2022   HCT 39.1 09/24/2022   MCV 88.3 09/24/2022   PLT 270 09/24/2022   Lab Results  Component Value Date   FERRITIN 20 12/22/2020   IRON 102 12/22/2020   TIBC 413 12/22/2020   UIBC 311 12/22/2020   IRONPCTSAT 25 12/22/2020   Lab Results  Component Value Date   RBC 4.43 09/24/2022   No results found for: "KPAFRELGTCHN", "LAMBDASER", "KAPLAMBRATIO" No results found for: "IGGSERUM", "IGA", "IGMSERUM" No results found for: "TOTALPROTELP", "ALBUMINELP", "A1GS", "A2GS", "BETS", "BETA2SER", "GAMS", "MSPIKE", "SPEI"   Chemistry      Component Value Date/Time   NA 142 03/24/2022 0945   K 3.9 03/24/2022 0945   CL 105 03/24/2022 0945   CO2 32 03/24/2022 0945   BUN 16  03/24/2022 0945   CREATININE 0.64 03/24/2022 0945      Component Value Date/Time   CALCIUM 9.3 03/24/2022 0945   ALKPHOS 56 03/24/2022 0945   AST 32 03/24/2022 0945   ALT 36 03/24/2022 0945   BILITOT 0.4 03/24/2022 0945       Impression and Plan: Ms. Aase is a very pleasant 31 yo female with protein S deficiency and history of DVT in the right lower extremity in 2018 and now 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  She continues to do well on Xarelto. No changes to her regimen.  Follow-up in 6 months.   Lottie Dawson, NP 12/15/202310:19 AM

## 2022-09-25 LAB — PROTEIN S ACTIVITY: Protein S Activity: 32 % — ABNORMAL LOW (ref 63–140)

## 2022-09-25 LAB — PROTEIN S, TOTAL: Protein S Ag, Total: 53 % — ABNORMAL LOW (ref 60–150)

## 2022-09-29 ENCOUNTER — Ambulatory Visit (INDEPENDENT_AMBULATORY_CARE_PROVIDER_SITE_OTHER): Payer: 59

## 2022-09-29 DIAGNOSIS — L501 Idiopathic urticaria: Secondary | ICD-10-CM

## 2022-10-05 ENCOUNTER — Other Ambulatory Visit (HOSPITAL_BASED_OUTPATIENT_CLINIC_OR_DEPARTMENT_OTHER): Payer: Self-pay

## 2022-10-15 ENCOUNTER — Other Ambulatory Visit (HOSPITAL_BASED_OUTPATIENT_CLINIC_OR_DEPARTMENT_OTHER): Payer: Self-pay

## 2022-10-27 ENCOUNTER — Ambulatory Visit (INDEPENDENT_AMBULATORY_CARE_PROVIDER_SITE_OTHER): Payer: 59

## 2022-10-27 DIAGNOSIS — L501 Idiopathic urticaria: Secondary | ICD-10-CM | POA: Diagnosis not present

## 2022-11-24 ENCOUNTER — Ambulatory Visit (INDEPENDENT_AMBULATORY_CARE_PROVIDER_SITE_OTHER): Payer: 59

## 2022-11-24 DIAGNOSIS — L501 Idiopathic urticaria: Secondary | ICD-10-CM | POA: Diagnosis not present

## 2022-12-13 ENCOUNTER — Other Ambulatory Visit (HOSPITAL_BASED_OUTPATIENT_CLINIC_OR_DEPARTMENT_OTHER): Payer: Self-pay

## 2022-12-22 ENCOUNTER — Ambulatory Visit (INDEPENDENT_AMBULATORY_CARE_PROVIDER_SITE_OTHER): Payer: 59

## 2022-12-22 DIAGNOSIS — L501 Idiopathic urticaria: Secondary | ICD-10-CM

## 2023-01-19 ENCOUNTER — Ambulatory Visit (INDEPENDENT_AMBULATORY_CARE_PROVIDER_SITE_OTHER): Payer: 59

## 2023-01-19 DIAGNOSIS — L501 Idiopathic urticaria: Secondary | ICD-10-CM

## 2023-02-11 ENCOUNTER — Other Ambulatory Visit (HOSPITAL_BASED_OUTPATIENT_CLINIC_OR_DEPARTMENT_OTHER): Payer: Self-pay

## 2023-02-14 ENCOUNTER — Ambulatory Visit (INDEPENDENT_AMBULATORY_CARE_PROVIDER_SITE_OTHER): Payer: 59 | Admitting: *Deleted

## 2023-02-14 DIAGNOSIS — L501 Idiopathic urticaria: Secondary | ICD-10-CM

## 2023-02-16 ENCOUNTER — Ambulatory Visit: Payer: 59

## 2023-03-16 ENCOUNTER — Ambulatory Visit (INDEPENDENT_AMBULATORY_CARE_PROVIDER_SITE_OTHER): Payer: 59 | Admitting: *Deleted

## 2023-03-16 DIAGNOSIS — L501 Idiopathic urticaria: Secondary | ICD-10-CM

## 2023-03-18 ENCOUNTER — Other Ambulatory Visit: Payer: Self-pay

## 2023-03-18 ENCOUNTER — Other Ambulatory Visit (HOSPITAL_BASED_OUTPATIENT_CLINIC_OR_DEPARTMENT_OTHER): Payer: Self-pay

## 2023-03-18 ENCOUNTER — Encounter: Payer: Self-pay | Admitting: Family

## 2023-03-18 ENCOUNTER — Inpatient Hospital Stay: Payer: 59 | Admitting: Family

## 2023-03-18 ENCOUNTER — Inpatient Hospital Stay: Payer: 59 | Attending: Hematology & Oncology

## 2023-03-18 VITALS — BP 103/66 | HR 75 | Temp 98.6°F | Resp 17 | Ht 62.0 in | Wt 205.1 lb

## 2023-03-18 DIAGNOSIS — Z79899 Other long term (current) drug therapy: Secondary | ICD-10-CM | POA: Diagnosis not present

## 2023-03-18 DIAGNOSIS — Z7901 Long term (current) use of anticoagulants: Secondary | ICD-10-CM | POA: Diagnosis not present

## 2023-03-18 DIAGNOSIS — I82402 Acute embolism and thrombosis of unspecified deep veins of left lower extremity: Secondary | ICD-10-CM

## 2023-03-18 DIAGNOSIS — D6859 Other primary thrombophilia: Secondary | ICD-10-CM | POA: Diagnosis not present

## 2023-03-18 DIAGNOSIS — Z86718 Personal history of other venous thrombosis and embolism: Secondary | ICD-10-CM | POA: Diagnosis present

## 2023-03-18 LAB — CMP (CANCER CENTER ONLY)
ALT: 17 U/L (ref 0–44)
AST: 22 U/L (ref 15–41)
Albumin: 3.9 g/dL (ref 3.5–5.0)
Alkaline Phosphatase: 53 U/L (ref 38–126)
Anion gap: 9 (ref 5–15)
BUN: 14 mg/dL (ref 6–20)
CO2: 25 mmol/L (ref 22–32)
Calcium: 8.9 mg/dL (ref 8.9–10.3)
Chloride: 105 mmol/L (ref 98–111)
Creatinine: 0.66 mg/dL (ref 0.44–1.00)
GFR, Estimated: >60 mL/min (ref >60)
Glucose, Bld: 92 mg/dL (ref 70–99)
Potassium: 4.2 mmol/L (ref 3.5–5.1)
Sodium: 139 mmol/L (ref 135–145)
Total Bilirubin: 0.4 mg/dL (ref 0.3–1.2)
Total Protein: 7.4 g/dL (ref 6.5–8.1)

## 2023-03-18 LAB — CBC WITH DIFFERENTIAL (CANCER CENTER ONLY)
Abs Immature Granulocytes: 0.01 10*3/uL (ref 0.00–0.07)
Basophils Absolute: 0 10*3/uL (ref 0.0–0.1)
Basophils Relative: 0 %
Eosinophils Absolute: 0.1 10*3/uL (ref 0.0–0.5)
Eosinophils Relative: 1 %
HCT: 37.9 % (ref 36.0–46.0)
Hemoglobin: 12.3 g/dL (ref 12.0–15.0)
Immature Granulocytes: 0 %
Lymphocytes Relative: 29 %
Lymphs Abs: 1.4 10*3/uL (ref 0.7–4.0)
MCH: 28.8 pg (ref 26.0–34.0)
MCHC: 32.5 g/dL (ref 30.0–36.0)
MCV: 88.8 fL (ref 80.0–100.0)
Monocytes Absolute: 0.5 10*3/uL (ref 0.1–1.0)
Monocytes Relative: 11 %
Neutro Abs: 2.9 10*3/uL (ref 1.7–7.7)
Neutrophils Relative %: 59 %
Platelet Count: 250 10*3/uL (ref 150–400)
RBC: 4.27 MIL/uL (ref 3.87–5.11)
RDW: 12.4 % (ref 11.5–15.5)
WBC Count: 4.9 10*3/uL (ref 4.0–10.5)
nRBC: 0 % (ref 0.0–0.2)

## 2023-03-18 LAB — LACTATE DEHYDROGENASE: LDH: 129 U/L (ref 98–192)

## 2023-03-18 NOTE — Progress Notes (Signed)
Hematology and Oncology Follow Up Visit  Nina Mondor [id number] 1991-01-10 31 y.o. 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 Peggs is here today for follow-up. She is doing well and has no complaints at this time.  Cycle is regular with heavy flow on the first 1-2 days. No other blood loss noted.  No bruising or petechiae.  No fever, chills, n/v, cough, rash, dizziness, SOB, chest pain, palpitations, abdominal pain or changes in bowel or bladder habits.  No swelling, tenderness, numbness or tingling in her extremities.  No falls or syncope.  Appetite and hydration are good. Weight is stable at 205 lbs.   ECOG Performance Status: 0 - Asymptomatic  Medications:  Allergies as of 03/18/2023   No Known Allergies      Medication List        Accurate as of March 18, 2023 10:12 AM. If you have any questions, ask your nurse or doctor.          cetirizine 10 MG tablet Commonly known as: ZYRTEC Take 1 tablet (10 mg total) by mouth daily.   hydrOXYzine 10 MG tablet Commonly known as: ATARAX Take 1 - 2 tablets by mouth every 8 hours as needed for itching/rash   Loratadine 10 MG Caps Take 10 mg by mouth daily.   triamcinolone 0.025 % ointment Commonly known as: KENALOG Apply 1 Application topically 2 (two) times daily.   Xarelto 20 MG Tabs tablet Generic drug: rivaroxaban TAKE 1 TABLET BY MOUTH ONCE DAILY WITH SUPPER   Xolair 150 MG/ML prefilled syringe Generic drug: omalizumab Inject into the skin.        Allergies: No Known Allergies  Past Medical History, Surgical history, Social history, and Family History were reviewed and updated.  Review of Systems: All other 10 point review of systems is negative.   Physical Exam:  vitals were not taken for  this visit.   Wt Readings from Last 3 Encounters:  09/24/22 204 lb (92.5 kg)  06/11/22 204 lb (92.5 kg)  03/24/22 201 lb (91.2 kg)    Ocular: Sclerae unicteric, pupils equal, round and reactive to light Ear-nose-throat: Oropharynx clear, dentition fair Lymphatic: No cervical or supraclavicular adenopathy Lungs no rales or rhonchi, good excursion bilaterally Heart regular rate and rhythm, no murmur appreciated Abd soft, nontender, positive bowel sounds MSK no focal spinal tenderness, no joint edema Neuro: non-focal, well-oriented, appropriate affect Breasts: Deferred   Lab Results  Component Value Date   WBC 4.9 03/18/2023   HGB 12.3 03/18/2023   HCT 37.9 03/18/2023   MCV 88.8 03/18/2023   PLT 250 03/18/2023   Lab Results  Component Value Date   FERRITIN 20 12/22/2020   IRON 102 12/22/2020   TIBC 413 12/22/2020   UIBC 311 12/22/2020   IRONPCTSAT 25 12/22/2020   Lab Results  Component Value Date   RBC 4.27 03/18/2023   No results found for: "KPAFRELGTCHN", "LAMBDASER", "KAPLAMBRATIO" No results found for: "IGGSERUM", "IGA", "IGMSERUM" No results found for: "TOTALPROTELP", "ALBUMINELP", "A1GS", "A2GS", "BETS", "BETA2SER", "GAMS", "MSPIKE", "SPEI"   Chemistry      Component Value Date/Time   NA 140 09/24/2022 1006   K 3.6 09/24/2022 1006   CL 105 09/24/2022 1006   CO2 27 09/24/2022 1006  BUN 16 09/24/2022 1006   CREATININE 0.66 09/24/2022 1006      Component Value Date/Time   CALCIUM 9.3 09/24/2022 1006   ALKPHOS 50 09/24/2022 1006   AST 15 09/24/2022 1006   ALT 14 09/24/2022 1006   BILITOT 0.3 09/24/2022 1006       Impression and Plan: Ms. Carchi is a very pleasant 32 yo female with protein S deficiency and history of DVT in the right lower extremity in 2018 and now 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  She continues to do well on Xarelto. No changes to her regimen.  Follow-up in 6 months.  Eileen Stanford, NP 6/7/202410:12 AM

## 2023-03-31 ENCOUNTER — Other Ambulatory Visit: Payer: Self-pay | Admitting: *Deleted

## 2023-03-31 MED ORDER — XOLAIR 150 MG/ML ~~LOC~~ SOSY: 300.0000 mg | PREFILLED_SYRINGE | SUBCUTANEOUS | 11 refills | Status: DC

## 2023-04-05 ENCOUNTER — Ambulatory Visit: Payer: 59 | Admitting: Medical

## 2023-04-05 ENCOUNTER — Other Ambulatory Visit (HOSPITAL_BASED_OUTPATIENT_CLINIC_OR_DEPARTMENT_OTHER): Payer: Self-pay

## 2023-04-05 VITALS — BP 124/66 | HR 86 | Resp 18 | Ht 62.0 in | Wt 205.0 lb

## 2023-04-05 DIAGNOSIS — B49 Unspecified mycosis: Secondary | ICD-10-CM | POA: Diagnosis not present

## 2023-04-05 MED ORDER — FLUCONAZOLE 150 MG PO TABS
150.0000 mg | ORAL_TABLET | Freq: Every day | ORAL | 0 refills | Status: DC
  Filled 2023-04-05: qty 1, 1d supply, fill #0

## 2023-04-05 MED ORDER — NYSTATIN 100000 UNIT/GM EX CREA
1.0000 | TOPICAL_CREAM | Freq: Two times a day (BID) | CUTANEOUS | 1 refills | Status: DC
  Filled 2023-04-05: qty 30, 15d supply, fill #0

## 2023-04-05 NOTE — Patient Instructions (Addendum)
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 No history of diabetes, but family history of diabetes in distant relatives. -Prescribe Diflucan one tablet for one day. -Prescribe Nystatin cream with a refill. -Advise to keep the area under the breasts as dry as possible. -If no improvement, patient to notify the office.  General Health Maintenance / Followup Plans -Schedule wellness exam for the week of July 8th or the following week. -Plan to check metabolic panel and HbA1c  if any sugar elevation since  recurrent yeast infections in past summers.Marland Kitchen

## 2023-04-05 NOTE — Progress Notes (Signed)
   Subjective:    Patient ID: Felicia Lawrence, female    DOB: 06/19/1991, 32 y.o.   MRN: [id number]  HPI Discussed the use of AI scribe software for clinical note transcription with the patient, who gave verbal consent to proceed.  History of Present Illness   The patient presents with a recurrent rash under both breasts, which has been more severe this summer than in previous years. The rash, characterized by redness and itching, typically appears during hot weather and improves upon returning indoors. The patient has been managing the condition with A and D ointment or gold bond powder, which has been effective in the past. However, the current episode has persisted for about a week and is not responding to the usual treatments. The patient has been experiencing this condition every summer for an unspecified number of years, estimated to be between three to five years. The patient denies a personal history of diabetes but reports a family history of the condition in distant relatives.        Review of Systems See hpi.    Objective:   Physical Exam  General- no acute distress. Skin- under both breast red rash by picture taken last night. Left breast moderat pinkish red rash. Rt side less prominent(pt reports no warmth no and not tenderness. So did not palpate. Relied on picture.      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 No history of diabetes, but family history of diabetes in distant relatives. -Prescribe Diflucan one tablet for one day. -Prescribe Nystatin cream with a refill. -Advise to keep the area under the breasts as dry as possible. -If no improvement, patient to notify the office.  General Health Maintenance / Followup Plans -Schedule wellness exam for the week of July 8th or the following week. -Plan to check metabolic panel and HbA1c  if any sugar elevation since  recurrent yeast  infections in past summers.Marland Kitchen

## 2023-04-12 NOTE — Patient Instructions (Incomplete)
1. Chronic urticaria - Your history does not have any "red flags" such as fevers, joint pains, or permanent skin changes that would be concerning for a more serious cause of hives.  - continue Xolair injections and have access to your epinephrine auto injector device. Prescription sent. Demonstration given. Emergency Action Plan given  - Continue Zyrtec 10 mg or Claritin 10 mg daily  2. Seasonal and perennial allergic rhinitis - Testing on 04/08/22 showed: grasses, ragweed, weeds, indoor molds, outdoor molds, dust mites, and cockroach.  - continue avoidance measures  - Continue taking: antihistamines as above for your hives - You can use an extra dose of the antihistamine, if needed, for breakthrough symptoms.  - Consider nasal saline rinses 1-2 times daily to remove allergens from the nasal cavities as well as help with mucous clearance (this is especially helpful to do before the nasal sprays are given) - I do not think that we need allergy shots, but we can reconsider in the future if needed.  3.Schedule a follow up appointment in 6 months       Reducing Pollen Exposure  The American Academy of Allergy, Asthma and Immunology suggests the following steps to reduce your exposure to pollen during allergy seasons.    Do not hang sheets or clothing out to dry; pollen may collect on these items. Do not mow lawns or spend time around freshly cut grass; mowing stirs up pollen. Keep windows closed at night.  Keep car windows closed while driving. Minimize morning activities outdoors, a time when pollen counts are usually at their highest. Stay indoors as much as possible when pollen counts or humidity is high and on windy days when pollen tends to remain in the air longer. Use air conditioning when possible.  Many air conditioners have filters that trap the pollen spores. Use a HEPA room air filter to remove pollen form the indoor air you breathe.  Control of Mold Allergen   Mold and  fungi can grow on a variety of surfaces provided certain temperature and moisture conditions exist.  Outdoor molds grow on plants, decaying vegetation and soil.  The major outdoor mold, Alternaria and Cladosporium, are found in very high numbers during hot and dry conditions.  Generally, a late Summer - Fall peak is seen for common outdoor fungal spores.  Rain will temporarily lower outdoor mold spore count, but counts rise rapidly when the rainy period ends.  The most important indoor molds are Aspergillus and Penicillium.  Dark, humid and poorly ventilated basements are ideal sites for mold growth.  The next most common sites of mold growth are the bathroom and the kitchen.  Outdoor (Seasonal) Mold Control  Positive outdoor molds via skin testing: Alternaria and Cladosporium  Use air conditioning and keep windows closed Avoid exposure to decaying vegetation. Avoid leaf raking. Avoid grain handling. Consider wearing a face mask if working in moldy areas.    Indoor (Perennial) Mold Control   Positive indoor molds via skin testing: Fusarium  Maintain humidity below 50%. Clean washable surfaces with 5% bleach solution. Remove sources e.g. contaminated carpets.    Control of Dust Mite Allergen    Dust mites play a major role in allergic asthma and rhinitis.  They occur in environments with high humidity wherever human skin is found.  Dust mites absorb humidity from the atmosphere (ie, they do not drink) and feed on organic matter (including shed human and animal skin).  Dust mites are a microscopic type of insect that you  cannot see with the naked eye.  High levels of dust mites have been detected from mattresses, pillows, carpets, upholstered furniture, bed covers, clothes, soft toys and any woven material.  The principal allergen of the dust mite is found in its feces.  A gram of dust may contain 1,000 mites and 250,000 fecal particles.  Mite antigen is easily measured in the air during  house cleaning activities.  Dust mites do not bite and do not cause harm to humans, other than by triggering allergies/asthma.    Ways to decrease your exposure to dust mites in your home:  Encase mattresses, box springs and pillows with a mite-impermeable barrier or cover   Wash sheets, blankets and drapes weekly in hot water (130 F) with detergent and dry them in a dryer on the hot setting.  Have the room cleaned frequently with a vacuum cleaner and a damp dust-mop.  For carpeting or rugs, vacuuming with a vacuum cleaner equipped with a high-efficiency particulate air (HEPA) filter.  The dust mite allergic individual should not be in a room which is being cleaned and should wait 1 hour after cleaning before going into the room. Do not sleep on upholstered furniture (eg, couches).   If possible removing carpeting, upholstered furniture and drapery from the home is ideal.  Horizontal blinds should be eliminated in the rooms where the person spends the most time (bedroom, study, television room).  Washable vinyl, roller-type shades are optimal. Remove all non-washable stuffed toys from the bedroom.  Wash stuffed toys weekly like sheets and blankets above.   Reduce indoor humidity to less than 50%.  Inexpensive humidity monitors can be purchased at most hardware stores.  Do not use a humidifier as can make the problem worse and are not recommended.  Control of Cockroach Allergen  Cockroach allergen has been identified as an important cause of acute attacks of asthma, especially in urban settings.  There are fifty-five species of cockroach that exist in the [place], however only three, the [place], [place] species produce allergen that can affect patients with Asthma.  Allergens can be obtained from fecal particles, egg casings and secretions from cockroaches.    Remove food sources. Reduce access to water. Seal access and entry points. Spray runways with 0.5-1% Diazinon or  Chlorpyrifos Blow boric acid power under stoves and refrigerator. Place bait stations (hydramethylnon) at feeding sites.

## 2023-04-13 ENCOUNTER — Other Ambulatory Visit (HOSPITAL_BASED_OUTPATIENT_CLINIC_OR_DEPARTMENT_OTHER): Payer: Self-pay

## 2023-04-13 ENCOUNTER — Ambulatory Visit: Payer: 59 | Admitting: Family

## 2023-04-13 ENCOUNTER — Ambulatory Visit: Payer: 59 | Admitting: *Deleted

## 2023-04-13 ENCOUNTER — Encounter: Payer: Self-pay | Admitting: Family

## 2023-04-13 ENCOUNTER — Other Ambulatory Visit: Payer: Self-pay

## 2023-04-13 VITALS — BP 124/78 | HR 87 | Resp 16 | Ht 62.0 in | Wt 204.9 lb

## 2023-04-13 DIAGNOSIS — L501 Idiopathic urticaria: Secondary | ICD-10-CM | POA: Diagnosis not present

## 2023-04-13 MED ORDER — EPINEPHRINE 0.3 MG/0.3ML IJ SOAJ
0.3000 mg | INTRAMUSCULAR | 1 refills | Status: DC | PRN
  Filled 2023-04-13: qty 2, 2d supply, fill #0

## 2023-04-13 NOTE — Progress Notes (Signed)
[address] [place] [address] Dept: [phone]  FOLLOW UP NOTE  Patient ID: Felicia Lawrence, female    DOB: 13-Mar-1991  Age: 32 y.o. MRN: [id number] Date of Office Visit: 04/13/2023  Assessment  Chief Complaint: Urticaria  HPI Felicia Lawrence is a 32 year old female who presents today for follow-up of chronic urticaria.  She was last seen on April 08, 2022 by Dr. Dellis Anes.  She reports no new diagnosis or surgery since her last office visit.  Chronic urticaria: She reports that she has not had hives since starting monthly Xolair injections.  She does continue to take Zyrtec or Claritin once a day, especially if she goes outside.  She denies any problems or reactions with her Xolair injections.  Seasonal and perennial allergic rhinitis: She reports occasional rhinorrhea and denies recent postnasal drip or nasal congestion.  She has not been treated for any sinus infections since we last saw her.  She continues to take Claritin or Zyrtec once a day.   Drug Allergies:  No Known Allergies  Review of Systems: Review of Systems  Constitutional:  Negative for chills and fever.  HENT:         Reports occasional rhinorrhea and denies nasal congestion and postnasal drip  Eyes:        Denies itchy watery eyes  Respiratory:  Negative for cough, shortness of breath and wheezing.   Cardiovascular:  Negative for chest pain and palpitations.  Gastrointestinal:        Denies heartburn or reflux symptoms  Skin:  Negative for itching and rash.  Neurological:  Negative for headaches.  Endo/Heme/Allergies:  Positive for environmental allergies.     Physical Exam: BP 124/78 (BP Location: Left Arm, Patient Position: Sitting, Cuff Size: Normal)   Pulse 87   Resp 16   Ht 5\' 2"  (1.575 m)   Wt 204 lb 14.4 oz (92.9 kg)   SpO2 97%   BMI 37.48 kg/m    Physical Exam Constitutional:      Appearance: Normal appearance.  HENT:     Head: Normocephalic and atraumatic.      Comments: Pharynx normal, eyes normal, ears normal, nose normal    Right Ear: Tympanic membrane, ear canal and external ear normal.     Left Ear: Tympanic membrane, ear canal and external ear normal.     Nose: Nose normal.     Mouth/Throat:     Mouth: Mucous membranes are moist.     Pharynx: Oropharynx is clear.  Eyes:     Conjunctiva/sclera: Conjunctivae normal.  Cardiovascular:     Rate and Rhythm: Regular rhythm.     Heart sounds: Normal heart sounds.  Pulmonary:     Effort: Pulmonary effort is normal.     Breath sounds: Normal breath sounds.     Comments: Lungs clear to auscultation Musculoskeletal:     Cervical back: Neck supple.  Skin:    General: Skin is warm.     Comments: No urticarial lesions or rash noted  Neurological:     Mental Status: She is alert and oriented to person, place, and time.  Psychiatric:        Mood and Affect: Mood normal.        Behavior: Behavior normal.        Thought Content: Thought content normal.        Judgment: Judgment normal.     Diagnostics:  none  Meds ordered this encounter  Medications   EPINEPHrine 0.3 mg/0.3 mL IJ  SOAJ injection    Sig: Inject 0.3 mg into the muscle as needed for anaphylaxis.    Dispense:  2 each    Refill:  1    Please dispense generic Mylan or Teva    Patient Instructions  1. Chronic urticaria - Your history does not have any "red flags" such as fevers, joint pains, or permanent skin changes that would be concerning for a more serious cause of hives.  - continue Xolair injections and have access to your epinephrine auto injector device. Prescription sent. Demonstration given. Emergency Action Plan given  - Continue Zyrtec 10 mg or Claritin 10 mg daily  2. Seasonal and perennial allergic rhinitis - Testing on 04/08/22 showed: grasses, ragweed, weeds, indoor molds, outdoor molds, dust mites, and cockroach.  - continue avoidance measures  - Continue taking: antihistamines as above for your hives - You  can use an extra dose of the antihistamine, if needed, for breakthrough symptoms.  - Consider nasal saline rinses 1-2 times daily to remove allergens from the nasal cavities as well as help with mucous clearance (this is especially helpful to do before the nasal sprays are given) - I do not think that we need allergy shots, but we can reconsider in the future if needed.  3.Schedule a follow up appointment in 6 months       Reducing Pollen Exposure  The American Academy of Allergy, Asthma and Immunology suggests the following steps to reduce your exposure to pollen during allergy seasons.    Do not hang sheets or clothing out to dry; pollen may collect on these items. Do not mow lawns or spend time around freshly cut grass; mowing stirs up pollen. Keep windows closed at night.  Keep car windows closed while driving. Minimize morning activities outdoors, a time when pollen counts are usually at their highest. Stay indoors as much as possible when pollen counts or humidity is high and on windy days when pollen tends to remain in the air longer. Use air conditioning when possible.  Many air conditioners have filters that trap the pollen spores. Use a HEPA room air filter to remove pollen form the indoor air you breathe.  Control of Mold Allergen   Mold and fungi can grow on a variety of surfaces provided certain temperature and moisture conditions exist.  Outdoor molds grow on plants, decaying vegetation and soil.  The major outdoor mold, Alternaria and Cladosporium, are found in very high numbers during hot and dry conditions.  Generally, a late Summer - Fall peak is seen for common outdoor fungal spores.  Rain will temporarily lower outdoor mold spore count, but counts rise rapidly when the rainy period ends.  The most important indoor molds are Aspergillus and Penicillium.  Dark, humid and poorly ventilated basements are ideal sites for mold growth.  The next most common sites of mold growth  are the bathroom and the kitchen.  Outdoor (Seasonal) Mold Control  Positive outdoor molds via skin testing: Alternaria and Cladosporium  Use air conditioning and keep windows closed Avoid exposure to decaying vegetation. Avoid leaf raking. Avoid grain handling. Consider wearing a face mask if working in moldy areas.    Indoor (Perennial) Mold Control   Positive indoor molds via skin testing: Fusarium  Maintain humidity below 50%. Clean washable surfaces with 5% bleach solution. Remove sources e.g. contaminated carpets.    Control of Dust Mite Allergen    Dust mites play a major role in allergic asthma and rhinitis.  They occur  in environments with high humidity wherever human skin is found.  Dust mites absorb humidity from the atmosphere (ie, they do not drink) and feed on organic matter (including shed human and animal skin).  Dust mites are a microscopic type of insect that you cannot see with the naked eye.  High levels of dust mites have been detected from mattresses, pillows, carpets, upholstered furniture, bed covers, clothes, soft toys and any woven material.  The principal allergen of the dust mite is found in its feces.  A gram of dust may contain 1,000 mites and 250,000 fecal particles.  Mite antigen is easily measured in the air during house cleaning activities.  Dust mites do not bite and do not cause harm to humans, other than by triggering allergies/asthma.    Ways to decrease your exposure to dust mites in your home:  Encase mattresses, box springs and pillows with a mite-impermeable barrier or cover   Wash sheets, blankets and drapes weekly in hot water (130 F) with detergent and dry them in a dryer on the hot setting.  Have the room cleaned frequently with a vacuum cleaner and a damp dust-mop.  For carpeting or rugs, vacuuming with a vacuum cleaner equipped with a high-efficiency particulate air (HEPA) filter.  The dust mite allergic individual should not be in a  room which is being cleaned and should wait 1 hour after cleaning before going into the room. Do not sleep on upholstered furniture (eg, couches).   If possible removing carpeting, upholstered furniture and drapery from the home is ideal.  Horizontal blinds should be eliminated in the rooms where the person spends the most time (bedroom, study, television room).  Washable vinyl, roller-type shades are optimal. Remove all non-washable stuffed toys from the bedroom.  Wash stuffed toys weekly like sheets and blankets above.   Reduce indoor humidity to less than 50%.  Inexpensive humidity monitors can be purchased at most hardware stores.  Do not use a humidifier as can make the problem worse and are not recommended.  Control of Cockroach Allergen  Cockroach allergen has been identified as an important cause of acute attacks of asthma, especially in urban settings.  There are fifty-five species of cockroach that exist in the [place], however only three, the [place], [place] species produce allergen that can affect patients with Asthma.  Allergens can be obtained from fecal particles, egg casings and secretions from cockroaches.    Remove food sources. Reduce access to water. Seal access and entry points. Spray runways with 0.5-1% Diazinon or Chlorpyrifos Blow boric acid power under stoves and refrigerator. Place bait stations (hydramethylnon) at feeding sites.        Return in about 6 months (around 10/14/2023), or if symptoms worsen or fail to improve.    Thank you for the opportunity to care for this patient.  Please do not hesitate to contact me with questions.  Nehemiah Settle, FNP [facility]

## 2023-04-14 IMAGING — US US EXTREM LOW VENOUS*R*
1 series · 13 of 24 positions shown · non-contrast
Comparison: None.

CLINICAL DATA: 30-year-old with history of DVT. Calf pain and
swelling.



[Series 1: us extrem low venous*right* · 13 of 43 slices shown]
[im 1/43]
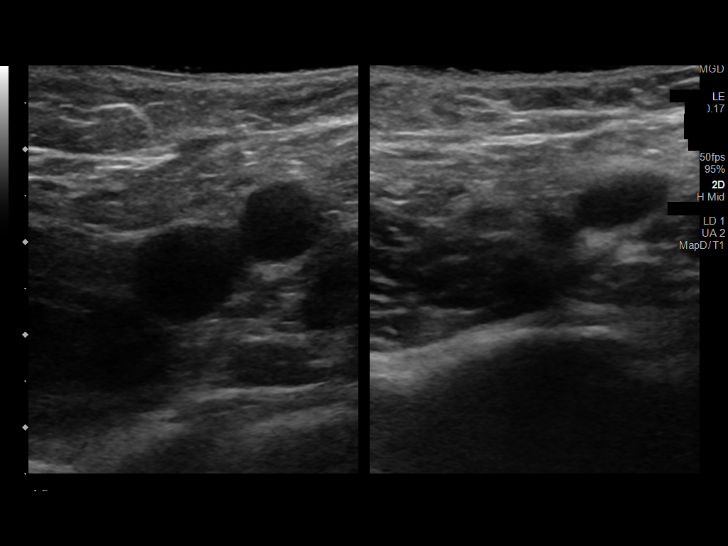
[im 4/43]
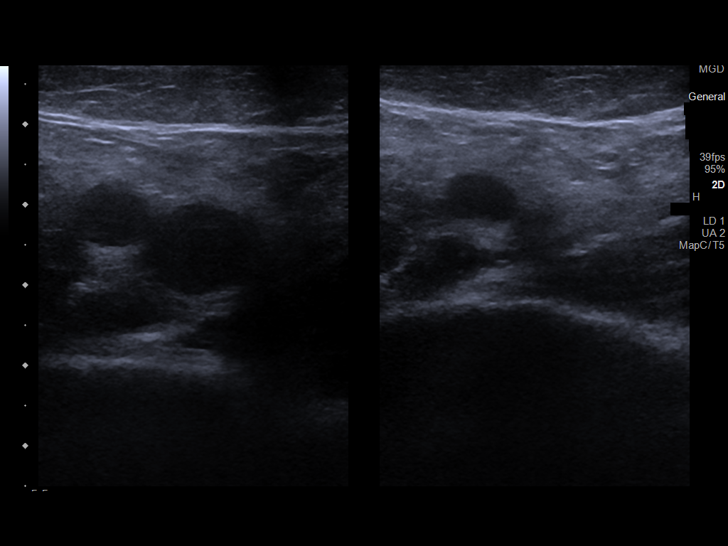
[im 8/43]
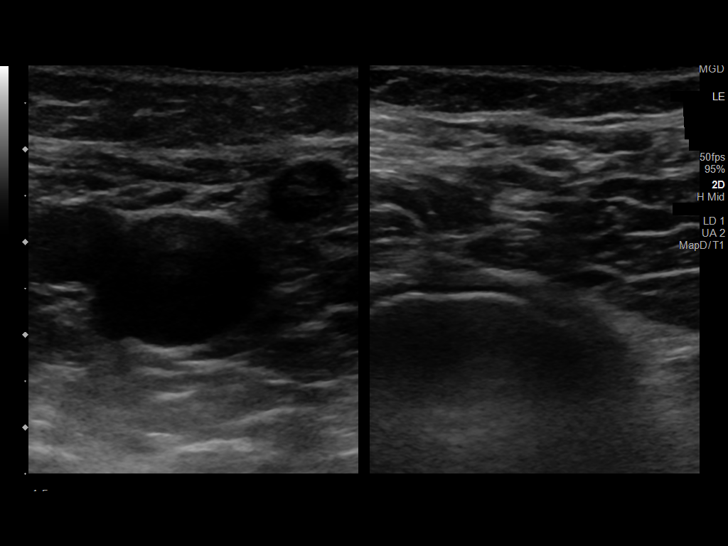
[im 11/43]
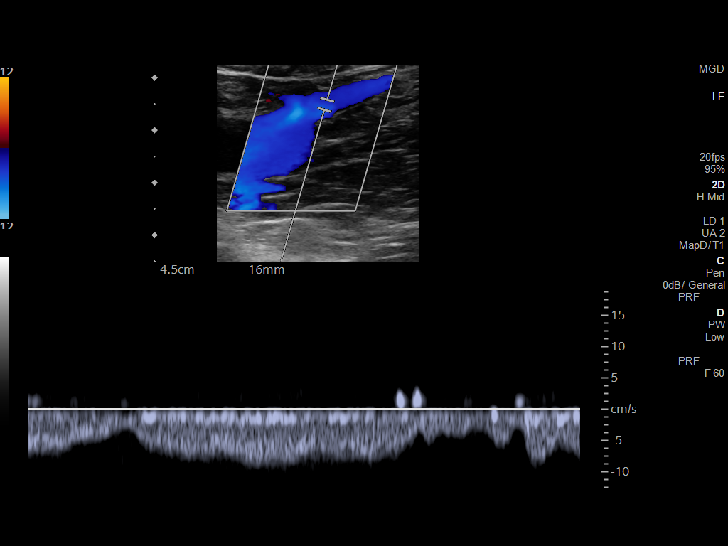
[im 15/43]
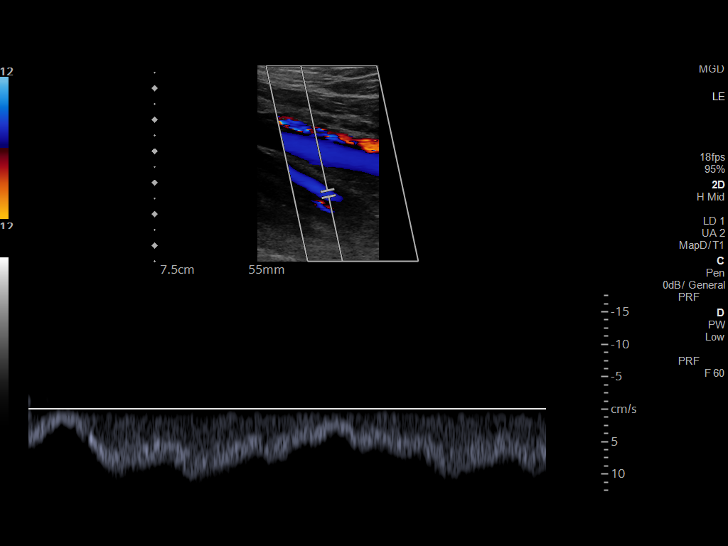
[im 19/43]
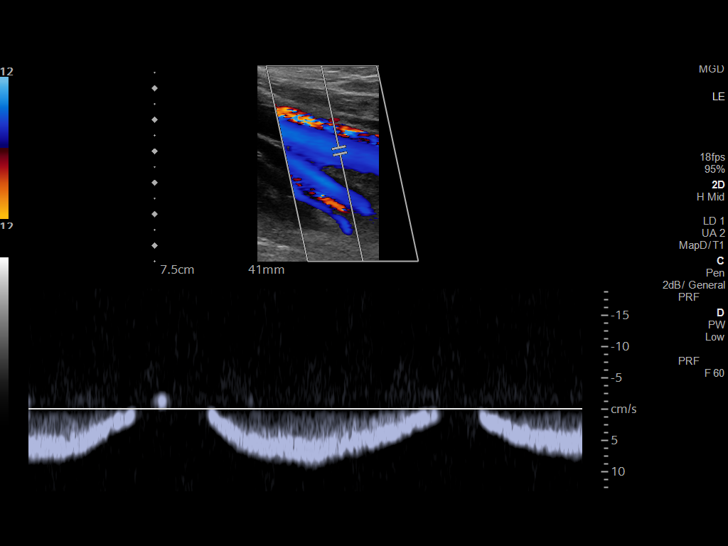
[im 22/43]
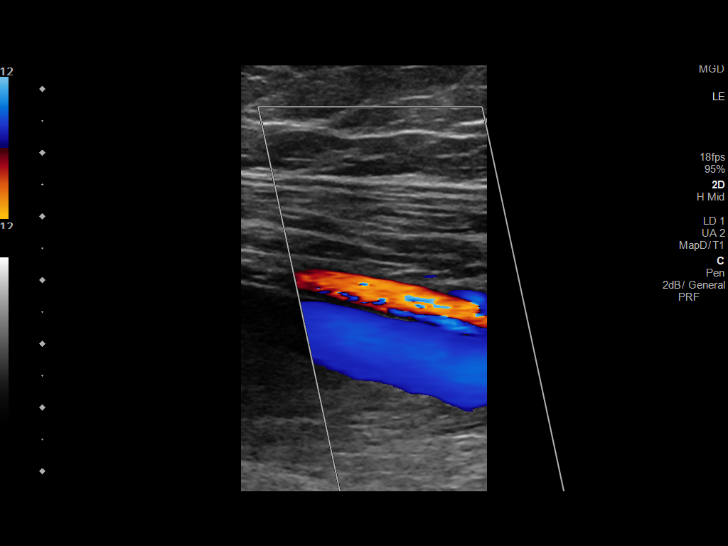
[im 24/43]
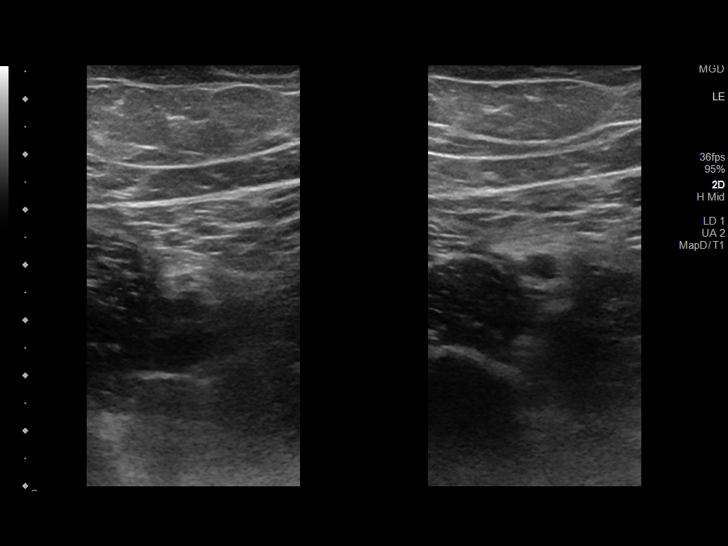
[im 28/43]
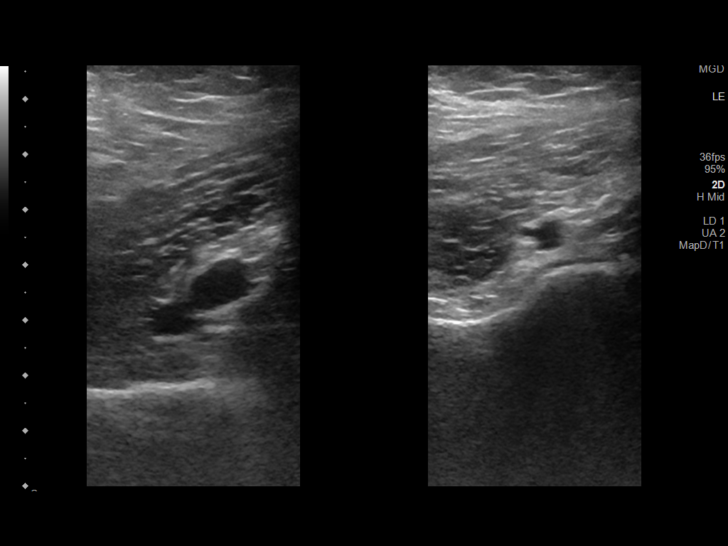
[im 32/43]
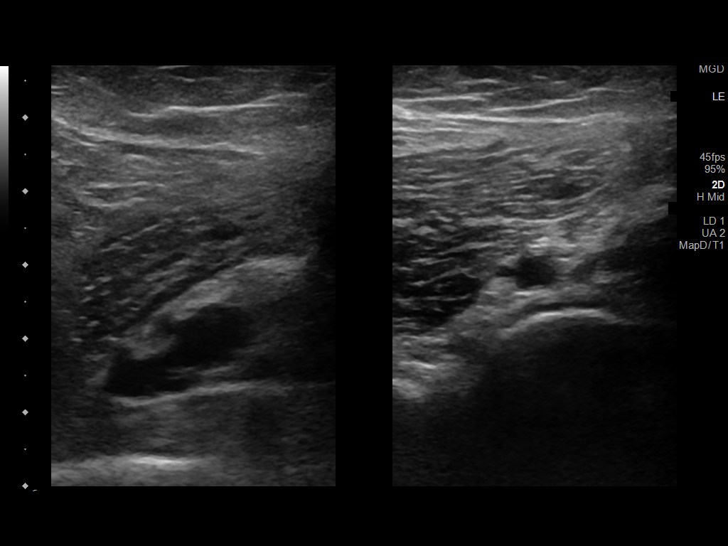
[im 35/43]
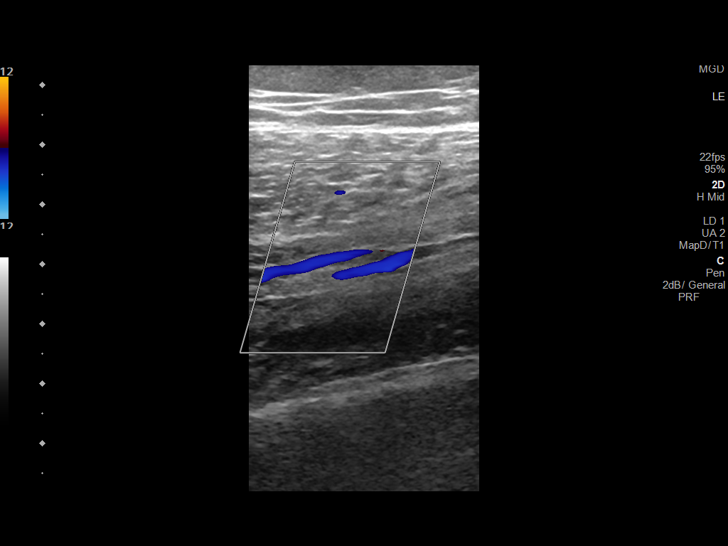
[im 39/43]
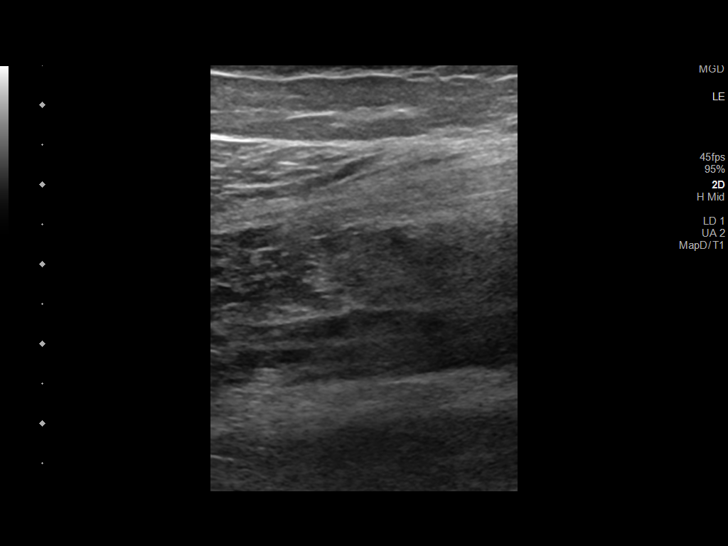
[im 43/43]
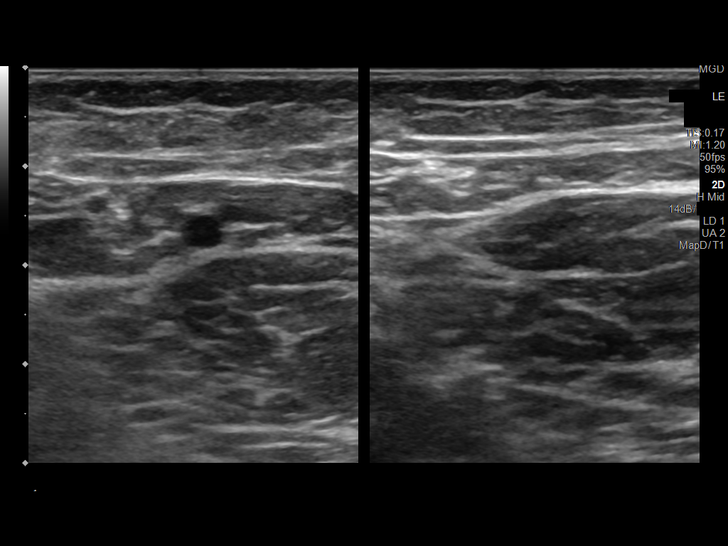

[13 of 24 positions shown; findings below may reference images not displayed]

FINDINGS: Contralateral Common Femoral Vein: No evidence of thrombus. Normal
compressibility and color Doppler flow.

Common Femoral Vein: No evidence of thrombus. Normal
compressibility, phasicity and color Doppler flow.

Saphenofemoral Junction: No evidence of thrombus. Normal
compressibility and flow on color Doppler imaging.

Profunda Femoral Vein: No evidence of thrombus. Normal
compressibility and flow on color Doppler imaging.

Femoral Vein: No evidence of thrombus. Normal compressibility,
phasicity and color Doppler flow.

Popliteal Vein: No evidence of thrombus. Normal compressibility,
color Doppler flow and phasicity.

Calf Veins: Visualized right deep calf veins are patent without
thrombus.

Superficial Great Saphenous Vein: No evidence of thrombus. Normal
compressibility.

Other Findings:  None.
IMPRESSION: Negative for deep venous thrombosis in right lower extremity.

## 2023-05-11 ENCOUNTER — Ambulatory Visit (INDEPENDENT_AMBULATORY_CARE_PROVIDER_SITE_OTHER): Payer: 59

## 2023-05-11 DIAGNOSIS — L501 Idiopathic urticaria: Secondary | ICD-10-CM | POA: Diagnosis not present

## 2023-06-02 ENCOUNTER — Ambulatory Visit: Payer: 59 | Admitting: Medical

## 2023-06-03 ENCOUNTER — Ambulatory Visit: Payer: 59 | Admitting: Medical

## 2023-06-03 VITALS — BP 110/60 | HR 70 | Temp 98.7°F | Resp 18 | Ht 62.0 in | Wt 203.6 lb

## 2023-06-03 DIAGNOSIS — Z0000 Encounter for general adult medical examination without abnormal findings: Secondary | ICD-10-CM | POA: Diagnosis not present

## 2023-06-03 DIAGNOSIS — M25571 Pain in right ankle and joints of right foot: Secondary | ICD-10-CM

## 2023-06-03 DIAGNOSIS — G8929 Other chronic pain: Secondary | ICD-10-CM | POA: Diagnosis not present

## 2023-06-03 DIAGNOSIS — Z23 Encounter for immunization: Secondary | ICD-10-CM | POA: Diagnosis not present

## 2023-06-03 DIAGNOSIS — Z13220 Encounter for screening for lipoid disorders: Secondary | ICD-10-CM | POA: Diagnosis not present

## 2023-06-03 LAB — LIPID PANEL
Cholesterol: 193 mg/dL (ref 0–200)
HDL: 63.4 mg/dL (ref >39.00)
LDL Cholesterol: 118 mg/dL — ABNORMAL HIGH (ref 0–99)
NonHDL: 129.32
Total CHOL/HDL Ratio: 3
Triglycerides: 55 mg/dL (ref 0.0–149.0)
VLDL: 11 mg/dL (ref 0.0–40.0)

## 2023-06-03 LAB — CBC WITH DIFFERENTIAL/PLATELET
Basophils Absolute: 0 10*3/uL (ref 0.0–0.1)
Basophils Relative: 0.5 % (ref 0.0–3.0)
Eosinophils Absolute: 0.1 10*3/uL (ref 0.0–0.7)
Eosinophils Relative: 1.1 % (ref 0.0–5.0)
HCT: 39.4 % (ref 36.0–46.0)
Hemoglobin: 12.7 g/dL (ref 12.0–15.0)
Lymphocytes Relative: 25.3 % (ref 12.0–46.0)
Lymphs Abs: 1.5 10*3/uL (ref 0.7–4.0)
MCHC: 32.3 g/dL (ref 30.0–36.0)
MCV: 88.2 fl (ref 78.0–100.0)
Monocytes Absolute: 0.7 10*3/uL (ref 0.1–1.0)
Monocytes Relative: 11.8 % (ref 3.0–12.0)
Neutro Abs: 3.5 10*3/uL (ref 1.4–7.7)
Neutrophils Relative %: 61.3 % (ref 43.0–77.0)
Platelets: 228 10*3/uL (ref 150.0–400.0)
RBC: 4.46 Mil/uL (ref 3.87–5.11)
RDW: 12.6 % (ref 11.5–15.5)
WBC: 5.7 10*3/uL (ref 4.0–10.5)

## 2023-06-03 LAB — COMPREHENSIVE METABOLIC PANEL
ALT: 13 U/L (ref 0–35)
AST: 16 U/L (ref 0–37)
Albumin: 3.9 g/dL (ref 3.5–5.2)
Alkaline Phosphatase: 53 U/L (ref 39–117)
BUN: 11 mg/dL (ref 6–23)
CO2: 27 mEq/L (ref 19–32)
Calcium: 8.9 mg/dL (ref 8.4–10.5)
Chloride: 105 mEq/L (ref 96–112)
Creatinine, Ser: 0.6 mg/dL (ref 0.40–1.20)
GFR: 118.52 mL/min (ref >60.00)
Glucose, Bld: 82 mg/dL (ref 70–99)
Potassium: 3.6 mEq/L (ref 3.5–5.1)
Sodium: 139 mEq/L (ref 135–145)
Total Bilirubin: 0.4 mg/dL (ref 0.2–1.2)
Total Protein: 6.7 g/dL (ref 6.0–8.3)

## 2023-06-03 NOTE — Progress Notes (Signed)
Subjective:    Patient ID: Felicia Lawrence, female    DOB: [birth year], 32 y.o.   MRN: [id number]  HPI Pt in for wellness exam  Pt is fasting.   Pt works as Radiation protection practitioner. Exercise 3-4 times a week. Eating healthy for most part. Caffeine none. Non smoker. No alcohol.  Pt last pap one year ago. Asking pt to get [place] copy of one she states will get this year.  Declines flu vaccine.  Tdap- due and she wil get.   Also rt foot pain. She states knot anterior area of ankle. She states has had for years. Size recent increase. More painful recently and had for about 5 years.   Review of Systems  Constitutional:  Negative for chills, fatigue and fever.  HENT:  Negative for congestion, drooling and ear pain.   Respiratory:  Negative for cough, chest tightness, shortness of breath, wheezing and stridor.   Cardiovascular:  Negative for chest pain and palpitations.  Gastrointestinal:  Negative for abdominal pain.  Genitourinary:  Negative for dysuria.  Musculoskeletal:        See hpi.  Neurological:  Negative for dizziness, seizures and light-headedness.  Hematological:  Negative for adenopathy. Does not bruise/bleed easily.  Psychiatric/Behavioral:  Negative for behavioral problems and decreased concentration.    Past Medical History:  Diagnosis Date   Asthma    only as a child.   DVT (deep venous thrombosis) (HCC)    Urticaria      Social History   Socioeconomic History   Marital status: Single    Spouse name: Not on file   Number of children: Not on file   Years of education: Not on file   Highest education level: 12th grade  Occupational History   Not on file  Tobacco Use   Smoking status: Never    Passive exposure: Never   Smokeless tobacco: Never  Vaping Use   Vaping status: Never Used  Substance and Sexual Activity   Alcohol use: No   Drug use: No   Sexual activity: Yes    Birth control/protection: None  Other Topics Concern   Not on file  Social  History Narrative   Not on file   Social Determinants of Health   Financial Resource Strain: Low Risk  (04/04/2023)   Overall Financial Resource Strain (CARDIA)    Difficulty of Paying Living Expenses: Not hard at all  Food Insecurity: No Food Insecurity (04/04/2023)   Hunger Vital Sign    Worried About Running Out of Food in the Last Year: Never true    Ran Out of Food in the Last Year: Never true  Transportation Needs: No Transportation Needs (04/04/2023)   PRAPARE - Administrator, Civil Service (Medical): No    Lack of Transportation (Non-Medical): No  Physical Activity: Sufficiently Active (04/04/2023)   Exercise Vital Sign    Days of Exercise per Week: 5 days    Minutes of Exercise per Session: 30 min  Stress: No Stress Concern Present (04/04/2023)   [employer] of Occupational Health - Occupational Stress Questionnaire    Feeling of Stress : Not at all  Social Connections: Unknown (04/04/2023)   Social Connection and Isolation Panel [NHANES]    Frequency of Communication with Friends and Family: More than three times a week    Frequency of Social Gatherings with Friends and Family: Once a week    Attends Religious Services: Patient declined    Database administrator or Organizations: No  Attends Banker Meetings: Not on file    Marital Status: Never married  Intimate Partner Violence: Not on file    Past Surgical History:  Procedure Laterality Date   TONSILLECTOMY      Family History  Problem Relation Age of Onset   Clotting disorder Mother    Eczema Sister     No Known Allergies  Current Outpatient Medications on File Prior to Visit  Medication Sig Dispense Refill   cetirizine (ZYRTEC) 10 MG tablet Take 1 tablet (10 mg total) by mouth daily. 100 tablet 5   EPINEPHrine 0.3 mg/0.3 mL IJ SOAJ injection Inject 0.3 mg into the muscle as needed for anaphylaxis. 2 each 1   fluconazole (DIFLUCAN) 150 MG tablet Take 1 tablet (150 mg total)  by mouth daily. 1 tablet 0   hydrOXYzine (ATARAX) 10 MG tablet Take 1 - 2 tablets by mouth every 8 hours as needed for itching/rash 30 tablet 0   Loratadine 10 MG CAPS Take 10 mg by mouth daily.     nystatin cream (MYCOSTATIN) Apply 1 application topically 2 (two) times daily. 30 g 1   rivaroxaban (XARELTO) 20 MG TABS tablet TAKE 1 TABLET BY MOUTH ONCE DAILY WITH SUPPER 30 tablet 6   triamcinolone (KENALOG) 0.025 % ointment Apply 1 Application topically 2 (two) times daily. 30 g 0   XOLAIR 150 MG/ML prefilled syringe Inject 300 mg into the skin every 28 (twenty-eight) days. 2 mL 11   Current Facility-Administered Medications on File Prior to Visit  Medication Dose Route Frequency Provider Last Rate Last Admin   omalizumab Geoffry Paradise) prefilled syringe 300 mg  300 mg Subcutaneous Q28 days Alfonse Spruce, MD   300 mg at 05/11/23 1337    BP 110/60 (BP Location: Left Arm, Patient Position: Sitting, Cuff Size: Normal)   Pulse 70   Temp 98.7 F (37.1 C) (Oral)   Resp 18   Ht 5\' 2"  (1.575 m)   Wt 203 lb 9.6 oz (92.4 kg)   SpO2 99%   BMI 37.24 kg/m        Objective:   Physical Exam  General Mental Status- Alert. General Appearance- Not in acute distress.   Skin General: Color- Normal Color. Moisture- Normal Moisture.  Neck Carotid Arteries- Normal color. Moisture- Normal Moisture. No carotid bruits. No JVD.  Chest and Lung Exam Auscultation: Breath Sounds:-Normal.  Cardiovascular Auscultation:Rythm- Regular. Murmurs & Other Heart Sounds:Auscultation of the heart reveals- No Murmurs.  Abdomen Inspection:-Inspeection Normal. Palpation/Percussion:Note:No mass. Palpation and Percussion of the abdomen reveal- Non Tender, Non Distended + BS, no rebound or guarding.    Neurologic Cranial Nerve exam:- CN III-XII intact(No nystagmus), symmetric smile. Strength:- 5/5 equal and symmetric strength both upper and lower extremities.   Rt ankle- anterior aspect 2 cm swollen area  that feels like probable ganglion cyst.    Assessment & Plan:   Patient Instructions  For you wellness exam today I have ordered cbc, cmp, tsh, lipid panel, ua and hiv.  Vaccine given today. Tdap.  When you get your pap done please have gyn send [place] report.  Recommend exercise and healthy diet.  We will let you know lab results as they come in.  Follow up date appointment will be determined after lab review.    For rt ankle pain and probable ganglion cyst placed referral to podiatrist.       Esperanza Richters, PA-C

## 2023-06-03 NOTE — Addendum Note (Signed)
Addended by: Mertha Finders on: 06/03/2023 01:48 PM   Modules accepted: Orders

## 2023-06-03 NOTE — Patient Instructions (Addendum)
For you wellness exam today I have ordered cbc, cmp, tsh, lipid panel, ua and hiv.  Vaccine given today. Tdap.  When you get your pap done please have gyn send [place] report.  Recommend exercise and healthy diet.  We will let you know lab results as they come in.  Follow up date appointment will be determined after lab review.    For rt ankle pain and probable ganglion cyst placed referral to podiatrist.  Preventive Care 56-33 Years Old, Female Preventive care refers to lifestyle choices and visits with your health care provider that can promote health and wellness. Preventive care visits are also called wellness exams. What can I expect for my preventive care visit? Counseling During your preventive care visit, your health care provider may ask about your: Medical history, including: Past medical problems. Family medical history. Pregnancy history. Current health, including: Menstrual cycle. Method of birth control. Emotional well-being. Home life and relationship well-being. Sexual activity and sexual health. Lifestyle, including: Alcohol, nicotine or tobacco, and drug use. Access to firearms. Diet, exercise, and sleep habits. Work and work Astronomer. Sunscreen use. Safety issues such as seatbelt and bike helmet use. Physical exam Your health care provider may check your: Height and weight. These may be used to calculate your BMI (body mass index). BMI is a measurement that tells if you are at a healthy weight. Waist circumference. This measures the distance around your waistline. This measurement also tells if you are at a healthy weight and may help predict your risk of certain diseases, such as type 2 diabetes and high blood pressure. Heart rate and blood pressure. Body temperature. Skin for abnormal spots. What immunizations do I need?  Vaccines are usually given at various ages, according to a schedule. Your health care provider will recommend vaccines for you based  on your age, medical history, and lifestyle or other factors, such as travel or where you work. What tests do I need? Screening Your health care provider may recommend screening tests for certain conditions. This may include: Pelvic exam and Pap test. Lipid and cholesterol levels. Diabetes screening. This is done by checking your blood sugar (glucose) after you have not eaten for a while (fasting). Hepatitis B test. Hepatitis C test. HIV (human immunodeficiency virus) test. STI (sexually transmitted infection) testing, if you are at risk. BRCA-related cancer screening. This may be done if you have a family history of breast, ovarian, tubal, or peritoneal cancers. Talk with your health care provider about your test results, treatment options, and if necessary, the need for more tests. Follow these instructions at home: Eating and drinking  Eat a healthy diet that includes fresh fruits and vegetables, whole grains, lean protein, and low-fat dairy products. Take vitamin and mineral supplements as recommended by your health care provider. Do not drink alcohol if: Your health care provider tells you not to drink. You are pregnant, may be pregnant, or are planning to become pregnant. If you drink alcohol: Limit how much you have to 0-1 drink a day. Know how much alcohol is in your drink. In the [place] one drink equals one 12 oz bottle of beer (355 mL), one 5 oz glass of wine (148 mL), or one 1 oz glass of hard liquor (44 mL). Lifestyle Brush your teeth every morning and night with fluoride toothpaste. Floss one time each day. Exercise for at least 30 minutes 5 or more days each week. Do not use any products that contain nicotine or tobacco. These products include cigarettes,  chewing tobacco, and vaping devices, such as e-cigarettes. If you need help quitting, ask your health care provider. Do not use drugs. If you are sexually active, practice safe sex. Use a condom or other form of protection  to prevent STIs. If you do not wish to become pregnant, use a form of birth control. If you plan to become pregnant, see your health care provider for a prepregnancy visit. Find healthy ways to manage stress, such as: Meditation, yoga, or listening to music. Journaling. Talking to a trusted person. Spending time with friends and family. Minimize exposure to UV radiation to reduce your risk of skin cancer. Safety Always wear your seat belt while driving or riding in a vehicle. Do not drive: If you have been drinking alcohol. Do not ride with someone who has been drinking. If you have been using any mind-altering substances or drugs. While texting. When you are tired or distracted. Wear a helmet and other protective equipment during sports activities. If you have firearms in your house, make sure you follow all gun safety procedures. Seek help if you have been physically or sexually abused. What's next? Go to your health care provider once a year for an annual wellness visit. Ask your health care provider how often you should have your eyes and teeth checked. Stay up to date on all vaccines. This information is not intended to replace advice given to you by your health care provider. Make sure you discuss any questions you have with your health care provider. Document Revised: 03/25/2021 Document Reviewed: 03/25/2021 Elsevier Patient Education  2024 [employer].

## 2023-06-05 ENCOUNTER — Telehealth: Payer: Self-pay | Admitting: Medical

## 2023-06-05 NOTE — Telephone Encounter (Signed)
Filled out pt physical exam form. Please fax it to [employer]

## 2023-06-06 NOTE — Telephone Encounter (Signed)
Paperwork faxed °

## 2023-06-08 ENCOUNTER — Ambulatory Visit (INDEPENDENT_AMBULATORY_CARE_PROVIDER_SITE_OTHER): Payer: 59 | Admitting: *Deleted

## 2023-06-08 DIAGNOSIS — L501 Idiopathic urticaria: Secondary | ICD-10-CM | POA: Diagnosis not present

## 2023-06-20 ENCOUNTER — Ambulatory Visit: Payer: 59 | Admitting: Podiatry

## 2023-06-20 ENCOUNTER — Ambulatory Visit (INDEPENDENT_AMBULATORY_CARE_PROVIDER_SITE_OTHER): Payer: 59

## 2023-06-20 DIAGNOSIS — G5761 Lesion of plantar nerve, right lower limb: Secondary | ICD-10-CM

## 2023-06-20 DIAGNOSIS — M67471 Ganglion, right ankle and foot: Secondary | ICD-10-CM

## 2023-06-20 NOTE — Progress Notes (Signed)
  Subjective:  Patient ID: Felicia Lawrence, female    DOB: [birth year],  MRN: [id number]  Chief Complaint  Patient presents with   Foot Pain    new pt-chronic R foot pain on top of foot(knot)-req appt time and [place] location-Edward Saguier, PA refer Right foot painful since 6 mos.    32 y.o. female presents with the above complaint. History confirmed with patient.  She works as a Corporate treasurer has noticed it developing and worsening over the last couple of months started become painful especially in her sneakers at home it feels like there is burning numbing or radiating pain to the toes  Objective:  Physical Exam: warm, good capillary refill, no trophic changes or ulcerative lesions, normal DP and PT pulses, normal sensory exam, and dorsal midfoot extensor ganglion cyst, tender to palpation and compression.   Radiographs: Multiple views x-ray of the right foot: Midfoot joint spaces are maintained there is some mild dorsal spurring noted in the [place] joints Assessment:   1. Ganglion cyst of right foot      Plan:  Patient was evaluated and treated and all questions answered.  We discussed etiology and treatment options of ganglion cysts including drainage and injection as well as surgical excision.  We discussed the pros and cons of each of these.  We discussed that both options still have some chance of recurrence.  We discussed drainage and injection today, however she does take Xarelto and likely will need compression bandage on for 48 hours after procedure and she is scheduled to go back to work today and tomorrow.  We will reschedule it for next week when she is able to take off work and will also allow her to hold 1 dose of Xarelto to reduce risk of postprocedural bleeding or hematoma  Return in about 10 days (around 06/30/2023) for ganglion drainage/injection.

## 2023-06-29 ENCOUNTER — Other Ambulatory Visit: Payer: Self-pay

## 2023-06-29 ENCOUNTER — Other Ambulatory Visit (HOSPITAL_BASED_OUTPATIENT_CLINIC_OR_DEPARTMENT_OTHER): Payer: Self-pay

## 2023-06-30 ENCOUNTER — Encounter: Payer: Self-pay | Admitting: Podiatry

## 2023-06-30 ENCOUNTER — Ambulatory Visit: Payer: 59 | Admitting: Podiatry

## 2023-06-30 VITALS — BP 128/80 | HR 83 | Temp 97.1°F | Resp 18 | Ht 62.0 in | Wt 200.0 lb

## 2023-06-30 DIAGNOSIS — M67471 Ganglion, right ankle and foot: Secondary | ICD-10-CM | POA: Diagnosis not present

## 2023-06-30 NOTE — Progress Notes (Signed)
Subjective:  Patient ID: Felicia Lawrence, female    DOB: [birth year],  MRN: [id number]  Chief Complaint  Patient presents with   Foot Pain    10 day F/U for ganglion drainage/injection     32 y.o. female presents with the above complaint. History confirmed with patient.  She returns for follow-up for treatment of her ganglion cyst today.  Her last dose of Xarelto was on Tuesday night.  Objective:  Physical Exam: warm, good capillary refill, no trophic changes or ulcerative lesions, normal DP and PT pulses, normal sensory exam, and dorsal midfoot extensor ganglion cyst, tender to palpation and compression.   Radiographs: Multiple views x-ray of the right foot: Midfoot joint spaces are maintained there is some mild dorsal spurring noted in the [place] joints Assessment:   1. Ganglion cyst of right foot      Plan:  Patient was evaluated and treated and all questions answered.  We discussed the risk and benefits of the procedure.  Following consent the right foot was anesthetized with a field block of 1 cc of 0.5% Marcaine plain and 2 cc of 2% lidocaine plain with epinephrine.  The area was prepped with Betadine and a #11 blade was used to puncture the cyst and overlying skin and subcutaneous tissue.  The contents of the cyst were expressed throughout the incision and a moderate amount of jellylike material was extruded from this.  There is no purulence or signs of infection.  Once the cyst had been evacuated manually I injected along the tendon sheath and cyst 10 mg of Kenalog and 4 mg of dexamethasone.  She tolerated this well.  A compression bandage was applied and she will leave this on for 48 hours.  She may resume her Xarelto tonight.  We discussed the risk of recurrence and if it recurs in a relatively short time, it may need surgical excision.  Return if symptoms worsen or fail to improve.

## 2023-07-06 ENCOUNTER — Ambulatory Visit (INDEPENDENT_AMBULATORY_CARE_PROVIDER_SITE_OTHER): Payer: 59

## 2023-07-06 DIAGNOSIS — L501 Idiopathic urticaria: Secondary | ICD-10-CM

## 2023-08-03 ENCOUNTER — Ambulatory Visit: Payer: 59 | Admitting: *Deleted

## 2023-08-03 DIAGNOSIS — L501 Idiopathic urticaria: Secondary | ICD-10-CM

## 2023-08-10 ENCOUNTER — Other Ambulatory Visit: Payer: Self-pay

## 2023-08-10 ENCOUNTER — Other Ambulatory Visit (HOSPITAL_BASED_OUTPATIENT_CLINIC_OR_DEPARTMENT_OTHER): Payer: Self-pay

## 2023-08-31 ENCOUNTER — Ambulatory Visit: Payer: 59 | Admitting: *Deleted

## 2023-08-31 DIAGNOSIS — L501 Idiopathic urticaria: Secondary | ICD-10-CM

## 2023-09-16 ENCOUNTER — Encounter: Payer: Self-pay | Admitting: Oncology

## 2023-09-16 ENCOUNTER — Other Ambulatory Visit (HOSPITAL_BASED_OUTPATIENT_CLINIC_OR_DEPARTMENT_OTHER): Payer: Self-pay

## 2023-09-16 ENCOUNTER — Inpatient Hospital Stay: Payer: 59 | Admitting: Oncology

## 2023-09-16 ENCOUNTER — Inpatient Hospital Stay: Payer: 59 | Attending: Family

## 2023-09-16 VITALS — BP 110/72 | HR 70 | Temp 98.1°F | Resp 18 | Ht 62.0 in | Wt 209.0 lb

## 2023-09-16 DIAGNOSIS — Z7901 Long term (current) use of anticoagulants: Secondary | ICD-10-CM | POA: Insufficient documentation

## 2023-09-16 DIAGNOSIS — Z86718 Personal history of other venous thrombosis and embolism: Secondary | ICD-10-CM | POA: Insufficient documentation

## 2023-09-16 DIAGNOSIS — I82402 Acute embolism and thrombosis of unspecified deep veins of left lower extremity: Secondary | ICD-10-CM

## 2023-09-16 DIAGNOSIS — D508 Other iron deficiency anemias: Secondary | ICD-10-CM

## 2023-09-16 DIAGNOSIS — D6859 Other primary thrombophilia: Secondary | ICD-10-CM

## 2023-09-16 LAB — CBC WITH DIFFERENTIAL (CANCER CENTER ONLY)
Abs Immature Granulocytes: 0.01 10*3/uL (ref 0.00–0.07)
Basophils Absolute: 0 10*3/uL (ref 0.0–0.1)
Basophils Relative: 1 %
Eosinophils Absolute: 0.1 10*3/uL (ref 0.0–0.5)
Eosinophils Relative: 2 %
HCT: 35.6 % — ABNORMAL LOW (ref 36.0–46.0)
Hemoglobin: 11.7 g/dL — ABNORMAL LOW (ref 12.0–15.0)
Immature Granulocytes: 0 %
Lymphocytes Relative: 39 %
Lymphs Abs: 1.9 10*3/uL (ref 0.7–4.0)
MCH: 29.2 pg (ref 26.0–34.0)
MCHC: 32.9 g/dL (ref 30.0–36.0)
MCV: 88.8 fL (ref 80.0–100.0)
Monocytes Absolute: 0.6 10*3/uL (ref 0.1–1.0)
Monocytes Relative: 12 %
Neutro Abs: 2.2 10*3/uL (ref 1.7–7.7)
Neutrophils Relative %: 46 %
Platelet Count: 229 10*3/uL (ref 150–400)
RBC: 4.01 MIL/uL (ref 3.87–5.11)
RDW: 11.9 % (ref 11.5–15.5)
WBC Count: 4.7 10*3/uL (ref 4.0–10.5)
nRBC: 0 % (ref 0.0–0.2)

## 2023-09-16 LAB — CMP (CANCER CENTER ONLY)
ALT: 16 U/L (ref 0–44)
AST: 16 U/L (ref 15–41)
Albumin: 3.9 g/dL (ref 3.5–5.0)
Alkaline Phosphatase: 47 U/L (ref 38–126)
Anion gap: 7 (ref 5–15)
BUN: 13 mg/dL (ref 6–20)
CO2: 30 mmol/L (ref 22–32)
Calcium: 9.2 mg/dL (ref 8.9–10.3)
Chloride: 107 mmol/L (ref 98–111)
Creatinine: 0.61 mg/dL (ref 0.44–1.00)
GFR, Estimated: >60 mL/min (ref >60)
Glucose, Bld: 83 mg/dL (ref 70–99)
Potassium: 3.8 mmol/L (ref 3.5–5.1)
Sodium: 144 mmol/L (ref 135–145)
Total Bilirubin: 0.3 mg/dL (ref <1.2)
Total Protein: 6.8 g/dL (ref 6.5–8.1)

## 2023-09-16 LAB — LACTATE DEHYDROGENASE: LDH: 137 U/L (ref 98–192)

## 2023-09-16 LAB — IRON AND IRON BINDING CAPACITY (CC-WL,HP ONLY)
Iron: 65 ug/dL (ref 28–170)
Saturation Ratios: 16 % (ref 10.4–31.8)
TIBC: 399 ug/dL (ref 250–450)
UIBC: 334 ug/dL (ref 148–442)

## 2023-09-16 LAB — FERRITIN: Ferritin: 19 ng/mL (ref 11–307)

## 2023-09-16 MED ORDER — RIVAROXABAN 20 MG PO TABS
ORAL_TABLET | Freq: Every day | ORAL | 6 refills | Status: DC
  Filled 2023-09-16: qty 30, 30d supply, fill #0
  Filled 2023-11-16: qty 30, 30d supply, fill #1
  Filled 2024-02-07: qty 30, 30d supply, fill #2
  Filled 2024-03-16: qty 30, 30d supply, fill #3
  Filled 2024-06-20: qty 30, 30d supply, fill #4
  Filled 2024-07-23: qty 30, 30d supply, fill #5
  Filled 2024-08-30: qty 30, 30d supply, fill #6

## 2023-09-16 NOTE — Progress Notes (Signed)
Hematology and Oncology Follow Up Visit  Felicia Lawrence [id number] 1991/10/05 32 y.o. 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 She is doing well and has no complaints at this time.  She is taking Xarelto 20 mg daily.  She tells me that she stopped the medication for the days that she has her period, but then resumes.  Other bleeding reported.  She is not having any fevers, chills, chest pain, shortness of breath, abdominal pain, nausea, vomiting.  ECOG Performance Status: 0 - Asymptomatic  Medications:  Allergies as of 09/16/2023   No Known Allergies      Medication List        Accurate as of September 16, 2023 12:17 PM. If you have any questions, ask your nurse or doctor.          cetirizine 10 MG tablet Commonly known as: ZYRTEC Take 1 tablet (10 mg total) by mouth daily.   EPINEPHrine 0.3 mg/0.3 mL Soaj injection Commonly known as: EPI-PEN Inject 0.3 mg into the muscle as needed for anaphylaxis.   fluconazole 150 MG tablet Commonly known as: Diflucan Take 1 tablet (150 mg total) by mouth daily.   hydrOXYzine 10 MG tablet Commonly known as: ATARAX Take 1 - 2 tablets by mouth every 8 hours as needed for itching/rash   Loratadine 10 MG Caps Take 10 mg by mouth daily.   nystatin cream Commonly known as: MYCOSTATIN Apply 1 application topically 2 (two) times daily.   triamcinolone 0.025 % ointment Commonly known as: KENALOG Apply 1 Application topically 2 (two) times daily.   Xarelto 20 MG Tabs tablet Generic drug: rivaroxaban TAKE 1 TABLET BY MOUTH ONCE DAILY WITH SUPPER   Xolair 150 MG/ML prefilled syringe Generic drug: omalizumab Inject 300 mg into the skin every 28 (twenty-eight) days.         Allergies: No Known Allergies  Past Medical History, Surgical history, Social history, and Family History were reviewed and updated.  Review of Systems: All other 10 point review of systems is negative.   Physical Exam:  height is 5\' 2"  (1.575 m) and weight is 94.8 kg. Her oral temperature is 98.1 F (36.7 C). Her blood pressure is 110/72 and her pulse is 70. Her respiration is 18 and oxygen saturation is 100%.   Wt Readings from Last 3 Encounters:  09/16/23 94.8 kg  06/30/23 90.7 kg  06/03/23 92.4 kg    Ocular: Sclerae unicteric, pupils equal, round and reactive to light Ear-nose-throat: Oropharynx clear, dentition fair Lymphatic: No cervical or supraclavicular adenopathy Lungs no rales or rhonchi, good excursion bilaterally Heart regular rate and rhythm, no murmur appreciated Abd soft, nontender, positive bowel sounds MSK no focal spinal tenderness, no joint edema Neuro: non-focal, well-oriented, appropriate affect Breasts: Deferred   Lab Results  Component Value Date   WBC 4.7 09/16/2023   HGB 11.7 (L) 09/16/2023   HCT 35.6 (L) 09/16/2023   MCV 88.8 09/16/2023   PLT 229 09/16/2023   Lab Results  Component Value Date   FERRITIN 20 12/22/2020   IRON 102 12/22/2020   TIBC 413 12/22/2020   UIBC 311 12/22/2020   IRONPCTSAT 25 12/22/2020   Lab Results  Component Value Date  RBC 4.01 09/16/2023   No results found for: "KPAFRELGTCHN", "LAMBDASER", "KAPLAMBRATIO" No results found for: "IGGSERUM", "IGA", "IGMSERUM" No results found for: "TOTALPROTELP", "ALBUMINELP", "A1GS", "A2GS", "BETS", "BETA2SER", "GAMS", "MSPIKE", "SPEI"   Chemistry      Component Value Date/Time   NA 144 09/16/2023 1004   K 3.8 09/16/2023 1004   CL 107 09/16/2023 1004   CO2 30 09/16/2023 1004   BUN 13 09/16/2023 1004   CREATININE 0.61 09/16/2023 1004      Component Value Date/Time   CALCIUM 9.2 09/16/2023 1004   ALKPHOS 47 09/16/2023 1004   AST 16 09/16/2023 1004   ALT 16  09/16/2023 1004   BILITOT 0.3 09/16/2023 1004       Impression and Plan: Felicia Lawrence is a very pleasant 32 yo female with protein S deficiency and history of DVT in the right lower extremity in 2018 and now 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  She continues to do well on Xarelto. No changes to her regimen.   CBC from today has been reviewed globin is mildly decreased at 11.7.  Will add on ferritin and iron studies today.  I have advised her to take ferrous sulfate 325 mg p.o. daily.  Does not tolerate this, then I would recommend a [place] vitamin daily taken with vitamin C to increase the iron absorption.  Follow-up in 6 months.  Clenton Pare, NP 12/6/202412:17 PM

## 2023-09-26 ENCOUNTER — Encounter: Payer: Self-pay | Admitting: Podiatry

## 2023-09-26 ENCOUNTER — Ambulatory Visit: Payer: 59 | Admitting: Podiatry

## 2023-09-26 DIAGNOSIS — M67471 Ganglion, right ankle and foot: Secondary | ICD-10-CM

## 2023-09-26 NOTE — Patient Instructions (Signed)
Call [facility] to schedule your MRI at the below locations.  Please allow at least 1 business day after your visit to process the referral.  It may take longer depending on approval from insurance.  Please let me know if you have issues or problems scheduling the MRI   [facility] [phone] [address] [place], [place] [address]  [facility] [phone] [address] [place], [place] [address]  

## 2023-09-27 NOTE — Progress Notes (Signed)
  Subjective:  Patient ID: Felicia Lawrence, female    DOB: 08-Jun-1991,  MRN: [id number]  Chief Complaint  Patient presents with   Ganglion Cyst    "I still have the cyst.  I want to talk about cutting it out."    32 y.o. female presents with the above complaint. History confirmed with patient.  She returns for follow-up on her right foot dorsal ganglion cyst it is starting to returning increase in size again and give her symptoms.    Objective:  Physical Exam: warm, good capillary refill, no trophic changes or ulcerative lesions, normal DP and PT pulses, normal sensory exam, and dorsal midfoot extensor ganglion cyst, tender to palpation and compression.   Radiographs: Multiple views x-ray of the right foot: Midfoot joint spaces are maintained there is some mild dorsal spurring noted in the [place] joints Assessment:   1. Ganglion cyst of right foot      Plan:  Patient was evaluated and treated and all questions answered.  We discussed the recurrence of a ganglion cyst.  Has only had a few months relief from incision and drainage here in the office.  We discussed surgical excision of the lesion which may offer an increased chance that resection with a lower chance of recurrence.  We discussed the risk benefits and potential occasions of surgical excision including scarring bleeding swelling pain and nerve irritation.  Informed sent signed and reviewed.  I did recommend an MRI prior to excision for mapping of the lesion.  This has been ordered.  Surgery will be scheduled at mutually agreeable date.   Surgical plan:  Procedure: -Excision of ganglion cyst of right foot  Location: -[facility]  Anesthesia plan: -IV sedation with local  Postoperative pain plan: - Tylenol 1000 mg every 6 hours, ibuprofen 600 mg every 6 hours, gabapentin 300 mg every 8 hours x5 days, oxycodone 5 mg 1-2 tabs every 6 hours only as needed  DVT prophylaxis: -None required  WB Restrictions / DME  needs: -WBAT in CAM boot postop    No follow-ups on file.

## 2023-09-28 ENCOUNTER — Ambulatory Visit: Payer: 59 | Admitting: *Deleted

## 2023-09-28 DIAGNOSIS — L501 Idiopathic urticaria: Secondary | ICD-10-CM

## 2023-10-26 ENCOUNTER — Encounter: Payer: Self-pay | Admitting: Podiatry

## 2023-10-26 ENCOUNTER — Telehealth: Payer: Self-pay | Admitting: Podiatry

## 2023-10-26 ENCOUNTER — Ambulatory Visit: Payer: 59 | Admitting: *Deleted

## 2023-10-26 DIAGNOSIS — L501 Idiopathic urticaria: Secondary | ICD-10-CM

## 2023-10-26 NOTE — Telephone Encounter (Signed)
 DOS- 11/18/23  EXC. GANGLION / TUMOR RT- 28090  [facility] EFFECTIVE DATE-10/12/23  DEDUCTIBLE- $500.00 WITH REMAINING $500.00 OOP-$5000.00 WITH REMAINING $5000.00 COINSURANCE- 20%  PER THE [employer] WEBSITE PORTAL, PRIOR AUTH IS NOT REQUIRED FOR CPT CODE 40981.  AUTH Decision ID #: [id number]

## 2023-10-30 ENCOUNTER — Ambulatory Visit
Admission: RE | Admit: 2023-10-30 | Discharge: 2023-10-30 | Disposition: A | Discharge status: Home / Self Care | Payer: 59 | Source: Ambulatory Visit | Attending: Podiatry | Admitting: Podiatry

## 2023-10-30 DIAGNOSIS — M67471 Ganglion, right ankle and foot: Secondary | ICD-10-CM

## 2023-10-30 MED ORDER — GADOPICLENOL 0.5 MMOL/ML IV SOLN
10.0000 mL | Freq: Once | INTRAVENOUS | Status: AC | PRN
  Administered 2023-10-30: 10 mL via INTRAVENOUS

## 2023-11-15 ENCOUNTER — Other Ambulatory Visit (HOSPITAL_BASED_OUTPATIENT_CLINIC_OR_DEPARTMENT_OTHER): Payer: Self-pay

## 2023-11-15 ENCOUNTER — Other Ambulatory Visit: Payer: Self-pay | Admitting: Podiatry

## 2023-11-15 MED ORDER — OXYCODONE HCL 5 MG PO TABS
5.0000 mg | ORAL_TABLET | Freq: Four times a day (QID) | ORAL | 0 refills | Status: AC | PRN
  Filled 2023-11-18: qty 20, 5d supply, fill #0

## 2023-11-15 MED ORDER — GABAPENTIN 300 MG PO CAPS
300.0000 mg | ORAL_CAPSULE | Freq: Three times a day (TID) | ORAL | 0 refills | Status: DC
  Filled 2023-11-15: qty 21, 7d supply, fill #0

## 2023-11-15 MED ORDER — ACETAMINOPHEN 500 MG PO TABS
1000.0000 mg | ORAL_TABLET | Freq: Four times a day (QID) | ORAL | 0 refills | Status: AC | PRN
  Filled 2023-11-15: qty 100, 13d supply, fill #0

## 2023-11-15 NOTE — Progress Notes (Signed)
2/7 ganglion excision

## 2023-11-16 ENCOUNTER — Other Ambulatory Visit (HOSPITAL_BASED_OUTPATIENT_CLINIC_OR_DEPARTMENT_OTHER): Payer: Self-pay

## 2023-11-18 ENCOUNTER — Other Ambulatory Visit (HOSPITAL_BASED_OUTPATIENT_CLINIC_OR_DEPARTMENT_OTHER): Payer: Self-pay

## 2023-11-18 DIAGNOSIS — M67471 Ganglion, right ankle and foot: Secondary | ICD-10-CM | POA: Diagnosis not present

## 2023-11-23 ENCOUNTER — Ambulatory Visit: Payer: 59

## 2023-11-23 DIAGNOSIS — L501 Idiopathic urticaria: Secondary | ICD-10-CM

## 2023-11-24 ENCOUNTER — Ambulatory Visit (INDEPENDENT_AMBULATORY_CARE_PROVIDER_SITE_OTHER): Payer: 59 | Admitting: Podiatry

## 2023-11-24 DIAGNOSIS — M67471 Ganglion, right ankle and foot: Secondary | ICD-10-CM

## 2023-11-24 NOTE — Progress Notes (Signed)
  Subjective:  Patient ID: Felicia Lawrence, female    DOB: 1991-07-23,  MRN: [id number]  Chief Complaint  Patient presents with   Routine Post Op    POV #1, DOS 11/18/23,REMOVAL OF GANGLION OF RIGHT FOOT Pt stated that she is doing well denies any nausea fever vomiting and or chills she also denies any pain at this time     33 y.o. female returns for post-op check.  Pain is controlled  Review of Systems: Negative except as noted in the HPI. Denies N/V/F/Ch.   Objective:  There were no vitals filed for this visit. There is no height or weight on file to calculate BMI. Constitutional Well developed. Well nourished.  Vascular Foot warm and well perfused. Capillary refill normal to all digits.  Calf is soft and supple, no posterior calf or knee pain, negative Homans' sign  Neurologic Normal speech. Oriented to person, place, and time. Epicritic sensation to light touch grossly present bilaterally.  Dermatologic Skin healing well without signs of infection. Skin edges well coapted without signs of infection.  Orthopedic: Little to no pain to palpation noted about the surgical site.   Pathology results showed benign ganglion cyst Assessment:   1. Ganglion cyst of right foot    Plan:  Patient was evaluated and treated and all questions answered.  S/p foot surgery right -Progressing as expected post-operatively.  Reviewed path results.  Expect low chance of recurrence with complete excision.  Return in 2 weeks to remove suture.  Continue CAM boot until then.  May shower immediately   Return in about 2 weeks (around 12/08/2023) for suture removal.

## 2023-12-08 ENCOUNTER — Ambulatory Visit (INDEPENDENT_AMBULATORY_CARE_PROVIDER_SITE_OTHER): Payer: 59 | Admitting: Podiatry

## 2023-12-08 DIAGNOSIS — M67471 Ganglion, right ankle and foot: Secondary | ICD-10-CM

## 2023-12-08 NOTE — Progress Notes (Signed)
  Subjective:  Patient ID: Felicia Lawrence, female    DOB: 1990-12-01,  MRN: [id number]  Chief Complaint  Patient presents with   Routine Post Op    DOS 11/18/23,REMOVAL OF GANGLION OF RIGHT FOOT Pt stated that she is doing okay    33 y.o. female returns for post-op check.   Review of Systems: Negative except as noted in the HPI. Denies N/V/F/Ch.   Objective:  There were no vitals filed for this visit. There is no height or weight on file to calculate BMI. Constitutional Well developed. Well nourished.  Vascular Foot warm and well perfused. Capillary refill normal to all digits.  Calf is soft and supple, no posterior calf or knee pain, negative Homans' sign  Neurologic Normal speech. Oriented to person, place, and time. Epicritic sensation to light touch grossly present bilaterally.  Dermatologic Skin healing well without signs of infection. Skin edges well coapted without signs of infection.  Orthopedic: Little to no pain to palpation noted about the surgical site.   Pathology results showed benign ganglion cyst Assessment:   1. Ganglion cyst of right foot     Plan:  Patient was evaluated and treated and all questions answered.  S/p foot surgery right -Sutures removed uneventfully return to regular shoe gear and activity as tolerated lotion and/or cream for scar as needed.  Encourage range of motion of digits and foot, follow-up in 3 weeks or as needed if doing better  No follow-ups on file.

## 2023-12-21 ENCOUNTER — Ambulatory Visit: Payer: 59 | Admitting: *Deleted

## 2023-12-21 DIAGNOSIS — L501 Idiopathic urticaria: Secondary | ICD-10-CM

## 2023-12-29 ENCOUNTER — Encounter: Payer: 59 | Admitting: Podiatry

## 2024-01-18 ENCOUNTER — Ambulatory Visit

## 2024-01-18 DIAGNOSIS — L501 Idiopathic urticaria: Secondary | ICD-10-CM

## 2024-02-07 ENCOUNTER — Other Ambulatory Visit (HOSPITAL_BASED_OUTPATIENT_CLINIC_OR_DEPARTMENT_OTHER): Payer: Self-pay

## 2024-02-15 ENCOUNTER — Ambulatory Visit

## 2024-02-15 DIAGNOSIS — L501 Idiopathic urticaria: Secondary | ICD-10-CM

## 2024-02-16 ENCOUNTER — Other Ambulatory Visit (HOSPITAL_BASED_OUTPATIENT_CLINIC_OR_DEPARTMENT_OTHER): Payer: Self-pay

## 2024-02-16 ENCOUNTER — Encounter: Payer: Self-pay | Admitting: Medical

## 2024-02-16 MED ORDER — NYSTATIN 100000 UNIT/GM EX CREA
1.0000 | TOPICAL_CREAM | Freq: Two times a day (BID) | CUTANEOUS | 1 refills | Status: AC
  Filled 2024-02-16: qty 30, 15d supply, fill #0

## 2024-02-16 NOTE — Addendum Note (Signed)
 Addended by: Serafina Damme on: 02/16/2024 05:22 PM   Modules accepted: Orders

## 2024-02-23 ENCOUNTER — Other Ambulatory Visit: Payer: Self-pay | Admitting: Allergy & Immunology

## 2024-03-14 ENCOUNTER — Ambulatory Visit

## 2024-03-14 DIAGNOSIS — L501 Idiopathic urticaria: Secondary | ICD-10-CM

## 2024-03-16 ENCOUNTER — Telehealth: Payer: Self-pay | Admitting: Allergy & Immunology

## 2024-03-16 ENCOUNTER — Other Ambulatory Visit (HOSPITAL_BASED_OUTPATIENT_CLINIC_OR_DEPARTMENT_OTHER): Payer: Self-pay

## 2024-03-16 ENCOUNTER — Encounter: Payer: Self-pay | Admitting: Family

## 2024-03-16 ENCOUNTER — Inpatient Hospital Stay: Payer: 59 | Admitting: Hematology & Oncology

## 2024-03-16 ENCOUNTER — Inpatient Hospital Stay: Payer: 59 | Attending: Family

## 2024-03-16 VITALS — BP 116/73 | HR 72 | Temp 98.8°F | Resp 18 | Ht 62.0 in | Wt 208.0 lb

## 2024-03-16 DIAGNOSIS — Z7901 Long term (current) use of anticoagulants: Secondary | ICD-10-CM | POA: Diagnosis not present

## 2024-03-16 DIAGNOSIS — I82402 Acute embolism and thrombosis of unspecified deep veins of left lower extremity: Secondary | ICD-10-CM

## 2024-03-16 DIAGNOSIS — Z86718 Personal history of other venous thrombosis and embolism: Secondary | ICD-10-CM

## 2024-03-16 DIAGNOSIS — D6859 Other primary thrombophilia: Secondary | ICD-10-CM | POA: Diagnosis not present

## 2024-03-16 DIAGNOSIS — D508 Other iron deficiency anemias: Secondary | ICD-10-CM | POA: Diagnosis not present

## 2024-03-16 LAB — IRON AND IRON BINDING CAPACITY (CC-WL,HP ONLY)
Iron: 46 ug/dL (ref 28–170)
Saturation Ratios: 11 % (ref 10.4–31.8)
TIBC: 428 ug/dL (ref 250–450)
UIBC: 382 ug/dL (ref 148–442)

## 2024-03-16 LAB — CMP (CANCER CENTER ONLY)
ALT: 16 U/L (ref 0–44)
AST: 17 U/L (ref 15–41)
Albumin: 4.3 g/dL (ref 3.5–5.0)
Alkaline Phosphatase: 53 U/L (ref 38–126)
Anion gap: 7 (ref 5–15)
BUN: 12 mg/dL (ref 6–20)
CO2: 29 mmol/L (ref 22–32)
Calcium: 9.9 mg/dL (ref 8.9–10.3)
Chloride: 108 mmol/L (ref 98–111)
Creatinine: 0.73 mg/dL (ref 0.44–1.00)
GFR, Estimated: >60 mL/min (ref >60)
Glucose, Bld: 97 mg/dL (ref 70–99)
Potassium: 4.8 mmol/L (ref 3.5–5.1)
Sodium: 144 mmol/L (ref 135–145)
Total Bilirubin: 0.3 mg/dL (ref 0.0–1.2)
Total Protein: 7.1 g/dL (ref 6.5–8.1)

## 2024-03-16 LAB — CBC WITH DIFFERENTIAL (CANCER CENTER ONLY)
Abs Immature Granulocytes: 0.01 10*3/uL (ref 0.00–0.07)
Basophils Absolute: 0 10*3/uL (ref 0.0–0.1)
Basophils Relative: 1 %
Eosinophils Absolute: 0.1 10*3/uL (ref 0.0–0.5)
Eosinophils Relative: 2 %
HCT: 36.2 % (ref 36.0–46.0)
Hemoglobin: 11.7 g/dL — ABNORMAL LOW (ref 12.0–15.0)
Immature Granulocytes: 0 %
Lymphocytes Relative: 30 %
Lymphs Abs: 1.4 10*3/uL (ref 0.7–4.0)
MCH: 28.5 pg (ref 26.0–34.0)
MCHC: 32.3 g/dL (ref 30.0–36.0)
MCV: 88.3 fL (ref 80.0–100.0)
Monocytes Absolute: 0.6 10*3/uL (ref 0.1–1.0)
Monocytes Relative: 12 %
Neutro Abs: 2.7 10*3/uL (ref 1.7–7.7)
Neutrophils Relative %: 55 %
Platelet Count: 210 10*3/uL (ref 150–400)
RBC: 4.1 MIL/uL (ref 3.87–5.11)
RDW: 12 % (ref 11.5–15.5)
WBC Count: 4.9 10*3/uL (ref 4.0–10.5)
nRBC: 0 % (ref 0.0–0.2)

## 2024-03-16 LAB — LACTATE DEHYDROGENASE: LDH: 133 U/L (ref 98–192)

## 2024-03-16 LAB — FERRITIN: Ferritin: 32 ng/mL (ref 11–307)

## 2024-03-16 NOTE — Telephone Encounter (Signed)
 Called to schedule Xolair reapproval appointment.

## 2024-03-16 NOTE — Progress Notes (Signed)
 Hematology and Oncology Follow Up Visit  Felicia Lawrence [id number] 1991/08/13 33 y.o. 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 She is doing well and has been taking her Xarelto  daily as prescribed.  She will hold 2-3 days with cycle if flow is extremely heavy. This does help to regulate her flow.  No other blood loss noted. No bruising or petechiae.  No fever, chills, n/v, cough, rash, dizziness, SOB, chest pain, palpitations, abdominal pain or changes in bowel or bladder habits.  No swelling, tenderness, numbness or tingling in her extremities.  Pedal pulses are 2+. No falls or syncope.  Appetite and hydration are good. Weight is stable at 208 lbs.   ECOG Performance Status: 1 - Symptomatic but completely ambulatory  Medications:  Allergies as of 03/16/2024   No Known Allergies      Medication List        Accurate as of March 16, 2024 12:35 PM. If you have any questions, ask your nurse or doctor.          STOP taking these medications    fluconazole  150 MG tablet Commonly known as: Diflucan  Stopped by: Kennard Pea   gabapentin  300 MG capsule Commonly known as: NEURONTIN  Stopped by: Kennard Pea       TAKE these medications    cetirizine  10 MG tablet Commonly known as: ZYRTEC  Take 1 tablet (10 mg total) by mouth daily.   EPINEPHrine  0.3 mg/0.3 mL Soaj injection Commonly known as: EPI-PEN Inject 0.3 mg into the muscle as needed for anaphylaxis.   hydrOXYzine  10 MG tablet Commonly known as: ATARAX  Take 1 - 2 tablets by mouth every 8 hours as needed for itching/rash   Loratadine  10 MG Caps Take 10 mg by mouth daily.   nystatin  cream Commonly known as: MYCOSTATIN  Apply 1 application topically 2 (two) times  daily.   triamcinolone  0.025 % ointment Commonly known as: KENALOG  Apply 1 Application topically 2 (two) times daily.   Xarelto  20 MG Tabs tablet Generic drug: rivaroxaban  TAKE 1 TABLET BY MOUTH ONCE DAILY WITH SUPPER   Xolair  150 MG/ML prefilled syringe Generic drug: omalizumab  INJECT 2 SYRINGES SUBCUTANEOUSLY EVERY 4 WEEKS        Allergies: No Known Allergies  Past Medical History, Surgical history, Social history, and Family History were reviewed and updated.  Review of Systems: All other 10 point review of systems is negative.   Physical Exam:  height is 5\' 2"  (1.575 m) and weight is 208 lb (94.3 kg). Her oral temperature is 98.8 F (37.1 C). Her blood pressure is 116/73 and her pulse is 72. Her respiration is 18 and oxygen saturation is 100%.   Wt Readings from Last 3 Encounters:  03/16/24 208 lb (94.3 kg)  09/16/23 209 lb (94.8 kg)  06/30/23 200 lb (90.7 kg)    Ocular: Sclerae unicteric, pupils equal, round and reactive to light Ear-nose-throat: Oropharynx clear, dentition fair Lymphatic: No cervical or supraclavicular adenopathy Lungs no rales or rhonchi, good excursion bilaterally Heart regular rate and rhythm, no murmur appreciated Abd soft, nontender, positive bowel sounds MSK no focal spinal tenderness, no joint edema Neuro: non-focal, well-oriented, appropriate affect Breasts: Deferred   Lab Results  Component Value  Date   WBC 4.9 03/16/2024   HGB 11.7 (L) 03/16/2024   HCT 36.2 03/16/2024   MCV 88.3 03/16/2024   PLT 210 03/16/2024   Lab Results  Component Value Date   FERRITIN 19 09/16/2023   IRON 65 09/16/2023   TIBC 399 09/16/2023   UIBC 334 09/16/2023   IRONPCTSAT 16 09/16/2023   Lab Results  Component Value Date   RBC 4.10 03/16/2024   No results found for: "KPAFRELGTCHN", "LAMBDASER", "KAPLAMBRATIO" No results found for: "IGGSERUM", "IGA", "IGMSERUM" No results found for: "TOTALPROTELP", "ALBUMINELP", "A1GS", "A2GS", "BETS",  "BETA2SER", "GAMS", "MSPIKE", "SPEI"   Chemistry      Component Value Date/Time   NA 144 03/16/2024 1007   K 4.8 03/16/2024 1007   CL 108 03/16/2024 1007   CO2 29 03/16/2024 1007   BUN 12 03/16/2024 1007   CREATININE 0.73 03/16/2024 1007      Component Value Date/Time   CALCIUM 9.9 03/16/2024 1007   ALKPHOS 53 03/16/2024 1007   AST 17 03/16/2024 1007   ALT 16 03/16/2024 1007   BILITOT 0.3 03/16/2024 1007       Impression and Plan: Felicia Lawrence is a very pleasant 33 yo female with protein S deficiency and history of DVT in the right lower extremity in 2018 and now 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  She continues to do well on Xarelto . No changes to her regimen.  Hgb stable at 11.7. Iron studies are pending.  Follow-up in 6 months.   Kennard Pea, NP 6/6/202512:35 PM

## 2024-04-03 ENCOUNTER — Other Ambulatory Visit (HOSPITAL_BASED_OUTPATIENT_CLINIC_OR_DEPARTMENT_OTHER): Payer: Self-pay

## 2024-04-03 ENCOUNTER — Ambulatory Visit: Payer: Self-pay | Admitting: Medical

## 2024-04-03 ENCOUNTER — Ambulatory Visit (HOSPITAL_BASED_OUTPATIENT_CLINIC_OR_DEPARTMENT_OTHER)
Admission: RE | Admit: 2024-04-03 | Discharge: 2024-04-03 | Disposition: A | Discharge status: Home / Self Care | Source: Ambulatory Visit | Attending: Medical | Admitting: Medical

## 2024-04-03 ENCOUNTER — Ambulatory Visit: Admitting: Medical

## 2024-04-03 VITALS — BP 110/74 | HR 90 | Temp 98.0°F | Resp 18 | Ht 62.0 in | Wt 205.2 lb

## 2024-04-03 DIAGNOSIS — M25552 Pain in left hip: Secondary | ICD-10-CM | POA: Insufficient documentation

## 2024-04-03 MED ORDER — METHYLPREDNISOLONE 4 MG PO TBPK
ORAL_TABLET | ORAL | 0 refills | Status: DC
  Filled 2024-04-03: qty 21, 6d supply, fill #0

## 2024-04-03 NOTE — Patient Instructions (Addendum)
 Left Hip Pain Persistent left hip pain, intensity varies low level at time but up to  8-9/10 at times, exacerbated by sitting and movement. Differential includes muscle pain, bursitis, or joint space narrowing. NSAIDs contraindicated due to Xarelto . - Order stat x-ray of the left hip. - Consider Medrol Dosepak pending x-ray results. - Advise Tylenol  500 mg every 6 to 8 hours for pain pending pending xray review. - Review x-ray results by tomorrow morning. - Consider sports medicine referral if pain persists.  Follow up date to be determined after xray review and after assessing response to treatment.

## 2024-04-03 NOTE — Progress Notes (Signed)
 Subjective:    Patient ID: Felicia Lawrence, female    DOB: Feb 18, 1991, 33 y.o.   MRN: [id number]  HPI Felicia Lawrence is a 33 year old female who presents with left hip pain.  She has been experiencing left hip pain for the past few weeks, with a gradual onset. The pain is described as a pulling sensation, particularly noticeable when transitioning from sitting to standing. It varies in intensity, sometimes reaching eight or nine out of ten, and tends to worsen by the end of the day or after certain movements.  She has not experienced hip pain prior to this episode and has not taken any over-the-counter medications due to uncertainty about the cause and her current medication restrictions. She is off Xarelto , which limits her options for pain management. The pain is not constant, with some days being better than others, and it does not hurt when pressing on the hip but does when rotating the leg in certain directions.  Her occupation involves working in Editor, commissioning and as a Marine scientist, which may involve prolonged periods of sitting. Sitting for a while leads to an ache in the hip, and certain movements, such as crossing her left leg, exacerbate the pain. No back pain is associated with her symptoms.  Lmp- March 18, 2024.  Review of Systems  Constitutional:  Negative for chills, fatigue and fever.  HENT:  Negative for congestion.   Respiratory:  Negative for cough, chest tightness and wheezing.   Cardiovascular:  Negative for chest pain and palpitations.  Gastrointestinal:  Negative for abdominal pain.  Musculoskeletal:        Left hip pain  Neurological:  Negative for dizziness, seizures and light-headedness.  Hematological:  Negative for adenopathy. Does not bruise/bleed easily.  Psychiatric/Behavioral:  Negative for behavioral problems and decreased concentration.    Past Medical History:  Diagnosis Date   Asthma    only as a child.   DVT (deep venous thrombosis) (HCC)     Urticaria      Social History   Socioeconomic History   Marital status: Single    Spouse name: Not on file   Number of children: Not on file   Years of education: Not on file   Highest education level: 12th grade  Occupational History   Not on file  Tobacco Use   Smoking status: Never    Passive exposure: Never   Smokeless tobacco: Never  Vaping Use   Vaping status: Never Used  Substance and Sexual Activity   Alcohol use: No   Drug use: No   Sexual activity: Yes    Birth control/protection: None  Other Topics Concern   Not on file  Social History Narrative   Not on file   Social Drivers of Health   Financial Resource Strain: Low Risk  (04/04/2023)   Overall Financial Resource Strain (CARDIA)    Difficulty of Paying Living Expenses: Not hard at all  Food Insecurity: No Food Insecurity (04/04/2023)   Hunger Vital Sign    Worried About Running Out of Food in the Last Year: Never true    Ran Out of Food in the Last Year: Never true  Transportation Needs: No Transportation Needs (04/04/2023)   PRAPARE - Administrator, Civil Service (Medical): No    Lack of Transportation (Non-Medical): No  Physical Activity: Sufficiently Active (04/04/2023)   Exercise Vital Sign    Days of Exercise per Week: 5 days    Minutes of Exercise per Session: 30  min  Stress: No Stress Concern Present (04/04/2023)   [employer] of Occupational Health - Occupational Stress Questionnaire    Feeling of Stress : Not at all  Social Connections: Unknown (04/04/2023)   Social Connection and Isolation Panel    Frequency of Communication with Friends and Family: More than three times a week    Frequency of Social Gatherings with Friends and Family: Once a week    Attends Religious Services: Patient declined    Database administrator or Organizations: No    Attends Engineer, structural: Not on file    Marital Status: Never married  Catering manager Violence: Not on file     Past Surgical History:  Procedure Laterality Date   TONSILLECTOMY      Family History  Problem Relation Age of Onset   Clotting disorder Mother    Eczema Sister     No Known Allergies  Current Outpatient Medications on File Prior to Visit  Medication Sig Dispense Refill   cetirizine  (ZYRTEC ) 10 MG tablet Take 1 tablet (10 mg total) by mouth daily. 100 tablet 5   EPINEPHrine  0.3 mg/0.3 mL IJ SOAJ injection Inject 0.3 mg into the muscle as needed for anaphylaxis. 2 each 1   hydrOXYzine  (ATARAX ) 10 MG tablet Take 1 - 2 tablets by mouth every 8 hours as needed for itching/rash 30 tablet 0   Loratadine  10 MG CAPS Take 10 mg by mouth daily.     nystatin  cream (MYCOSTATIN ) Apply 1 application topically 2 (two) times daily. 30 g 1   rivaroxaban  (XARELTO ) 20 MG TABS tablet TAKE 1 TABLET BY MOUTH ONCE DAILY WITH SUPPER 30 tablet 6   triamcinolone  (KENALOG ) 0.025 % ointment Apply 1 Application topically 2 (two) times daily. 30 g 0   XOLAIR  150 MG/ML prefilled syringe INJECT 2 SYRINGES SUBCUTANEOUSLY EVERY 4 WEEKS 2 mL 11   Current Facility-Administered Medications on File Prior to Visit  Medication Dose Route Frequency Provider Last Rate Last Admin   omalizumab  (XOLAIR ) prefilled syringe 300 mg  300 mg Subcutaneous Q28 days Iva Marty Saltness, MD   300 mg at 03/14/24 1310    BP 110/74   Pulse 90   Temp 98 F (36.7 C)   Resp 18   Ht 5' 2 (1.575 m)   Wt 205 lb 3.2 oz (93.1 kg)   SpO2 99%   BMI 37.53 kg/m        Objective:   Physical Exam General- No acute distress. Pleasant patient. Neck- Full range of motion, no jvd Lungs- Clear, even and unlabored. Heart- regular rate and rhythm. Neurologic- CNII- XII grossly intact.  Left hip pain on internal rotation but not on external rotation. Pain on lateral aspect and in groin area when internally rotates.  Back- no cva tendernes. No si area pain on papation.        Assessment & Plan:   Patient Instructions  Left Hip  Pain Persistent left hip pain, intensity varies low level at time but up to  8-9/10 at times, exacerbated by sitting and movement. Differential includes muscle pain, bursitis, or joint space narrowing. NSAIDs contraindicated due to Xarelto . - Order stat x-ray of the left hip. - Consider Medrol Dosepak pending x-ray results. - Advise Tylenol  500 mg every 6 to 8 hours for pain. - Review x-ray results by tomorrow morning. - Consider sports medicine referral if pain persists.  Follow up date to be determined after xray review and after assessing response to treatment.

## 2024-04-03 NOTE — Addendum Note (Signed)
 Addended by: DORINA DALLAS HERO on: 04/03/2024 04:59 PM   Modules accepted: Orders

## 2024-04-11 ENCOUNTER — Ambulatory Visit

## 2024-04-11 DIAGNOSIS — L501 Idiopathic urticaria: Secondary | ICD-10-CM

## 2024-05-08 ENCOUNTER — Ambulatory Visit (INDEPENDENT_AMBULATORY_CARE_PROVIDER_SITE_OTHER)

## 2024-05-08 DIAGNOSIS — L501 Idiopathic urticaria: Secondary | ICD-10-CM | POA: Diagnosis not present

## 2024-05-09 ENCOUNTER — Ambulatory Visit

## 2024-06-06 ENCOUNTER — Ambulatory Visit (INDEPENDENT_AMBULATORY_CARE_PROVIDER_SITE_OTHER)

## 2024-06-06 DIAGNOSIS — L501 Idiopathic urticaria: Secondary | ICD-10-CM | POA: Diagnosis not present

## 2024-06-20 ENCOUNTER — Other Ambulatory Visit (HOSPITAL_BASED_OUTPATIENT_CLINIC_OR_DEPARTMENT_OTHER): Payer: Self-pay

## 2024-06-20 ENCOUNTER — Ambulatory Visit: Admitting: Medical

## 2024-06-20 VITALS — BP 122/82 | Temp 97.7°F | Resp 16 | Ht 62.0 in | Wt 209.2 lb

## 2024-06-20 DIAGNOSIS — Z0000 Encounter for general adult medical examination without abnormal findings: Secondary | ICD-10-CM

## 2024-06-20 LAB — CBC WITH DIFFERENTIAL/PLATELET
Basophils Absolute: 0 K/uL (ref 0.0–0.1)
Basophils Relative: 0.5 % (ref 0.0–3.0)
Eosinophils Absolute: 0.1 K/uL (ref 0.0–0.7)
Eosinophils Relative: 1.5 % (ref 0.0–5.0)
HCT: 36.4 % (ref 36.0–46.0)
Hemoglobin: 12 g/dL (ref 12.0–15.0)
Lymphocytes Relative: 36.4 % (ref 12.0–46.0)
Lymphs Abs: 1.5 K/uL (ref 0.7–4.0)
MCHC: 32.9 g/dL (ref 30.0–36.0)
MCV: 85.5 fl (ref 78.0–100.0)
Monocytes Absolute: 0.5 K/uL (ref 0.1–1.0)
Monocytes Relative: 11.5 % (ref 3.0–12.0)
Neutro Abs: 2 K/uL (ref 1.4–7.7)
Neutrophils Relative %: 50.1 % (ref 43.0–77.0)
Platelets: 245 K/uL (ref 150.0–400.0)
RBC: 4.26 Mil/uL (ref 3.87–5.11)
RDW: 12.6 % (ref 11.5–15.5)
WBC: 4 K/uL (ref 4.0–10.5)

## 2024-06-20 LAB — COMPREHENSIVE METABOLIC PANEL WITH GFR
ALT: 17 U/L (ref 0–35)
AST: 16 U/L (ref 0–37)
Albumin: 4.1 g/dL (ref 3.5–5.2)
Alkaline Phosphatase: 50 U/L (ref 39–117)
BUN: 12 mg/dL (ref 6–23)
CO2: 30 meq/L (ref 19–32)
Calcium: 9.1 mg/dL (ref 8.4–10.5)
Chloride: 104 meq/L (ref 96–112)
Creatinine, Ser: 0.57 mg/dL (ref 0.40–1.20)
GFR: 119.11 mL/min (ref >60.00)
Glucose, Bld: 76 mg/dL (ref 70–99)
Potassium: 4 meq/L (ref 3.5–5.1)
Sodium: 142 meq/L (ref 135–145)
Total Bilirubin: 0.3 mg/dL (ref 0.2–1.2)
Total Protein: 6.7 g/dL (ref 6.0–8.3)

## 2024-06-20 LAB — LIPID PANEL
Cholesterol: 189 mg/dL (ref 0–200)
HDL: 68.4 mg/dL (ref >39.00)
LDL Cholesterol: 113 mg/dL — ABNORMAL HIGH (ref 0–99)
NonHDL: 120.38
Total CHOL/HDL Ratio: 3
Triglycerides: 35 mg/dL (ref 0.0–149.0)
VLDL: 7 mg/dL (ref 0.0–40.0)

## 2024-06-20 NOTE — Patient Instructions (Addendum)
 For you wellness exam today I have ordered cbc, cmp and lipid panel.   Declines flu vaccine.  Pt not sure if she she wants hep b surface antibody presently. Cost concerns if covered? Can ask your insurance.   Up to date on tetanus.  Please get us  copies of hpv vaccine records and last pap  Recommend exercise and healthy diet. Consider [employer] app or my fitness pal along with exercise for your desired wt loss.  We will let you know lab results as they come in.  Follow up date appointment will be determined after lab review.    Holding form until labs back then will fill out for work wellness program.Preventive Care 3-71 Years Old, Female Preventive care refers to lifestyle choices and visits with your health care provider that can promote health and wellness. Preventive care visits are also called wellness exams. What can I expect for my preventive care visit? Counseling During your preventive care visit, your health care provider may ask about your: Medical history, including: Past medical problems. Family medical history. Pregnancy history. Current health, including: Menstrual cycle. Method of birth control. Emotional well-being. Home life and relationship well-being. Sexual activity and sexual health. Lifestyle, including: Alcohol, nicotine or tobacco, and drug use. Access to firearms. Diet, exercise, and sleep habits. Work and work Astronomer. Sunscreen use. Safety issues such as seatbelt and bike helmet use. Physical exam Your health care provider may check your: Height and weight. These may be used to calculate your BMI (body mass index). BMI is a measurement that tells if you are at a healthy weight. Waist circumference. This measures the distance around your waistline. This measurement also tells if you are at a healthy weight and may help predict your risk of certain diseases, such as type 2 diabetes and high blood pressure. Heart rate and blood pressure. Body  temperature. Skin for abnormal spots. What immunizations do I need?  Vaccines are usually given at various ages, according to a schedule. Your health care provider will recommend vaccines for you based on your age, medical history, and lifestyle or other factors, such as travel or where you work. What tests do I need? Screening Your health care provider may recommend screening tests for certain conditions. This may include: Pelvic exam and Pap test. Lipid and cholesterol levels. Diabetes screening. This is done by checking your blood sugar (glucose) after you have not eaten for a while (fasting). Hepatitis B test. Hepatitis C test. HIV (human immunodeficiency virus) test. STI (sexually transmitted infection) testing, if you are at risk. BRCA-related cancer screening. This may be done if you have a family history of breast, ovarian, tubal, or peritoneal cancers. Talk with your health care provider about your test results, treatment options, and if necessary, the need for more tests. Follow these instructions at home: Eating and drinking  Eat a healthy diet that includes fresh fruits and vegetables, whole grains, lean protein, and low-fat dairy products. Take vitamin and mineral supplements as recommended by your health care provider. Do not drink alcohol if: Your health care provider tells you not to drink. You are pregnant, may be pregnant, or are planning to become pregnant. If you drink alcohol: Limit how much you have to 0-1 drink a day. Know how much alcohol is in your drink. In the [place] one drink equals one 12 oz bottle of beer (355 mL), one 5 oz glass of wine (148 mL), or one 1 oz glass of hard liquor (44 mL). Lifestyle Brush your teeth  every morning and night with fluoride toothpaste. Floss one time each day. Exercise for at least 30 minutes 5 or more days each week. Do not use any products that contain nicotine or tobacco. These products include cigarettes, chewing tobacco,  and vaping devices, such as e-cigarettes. If you need help quitting, ask your health care provider. Do not use drugs. If you are sexually active, practice safe sex. Use a condom or other form of protection to prevent STIs. If you do not wish to become pregnant, use a form of birth control. If you plan to become pregnant, see your health care provider for a prepregnancy visit. Find healthy ways to manage stress, such as: Meditation, yoga, or listening to music. Journaling. Talking to a trusted person. Spending time with friends and family. Minimize exposure to UV radiation to reduce your risk of skin cancer. Safety Always wear your seat belt while driving or riding in a vehicle. Do not drive: If you have been drinking alcohol. Do not ride with someone who has been drinking. If you have been using any mind-altering substances or drugs. While texting. When you are tired or distracted. Wear a helmet and other protective equipment during sports activities. If you have firearms in your house, make sure you follow all gun safety procedures. Seek help if you have been physically or sexually abused. What's next? Go to your health care provider once a year for an annual wellness visit. Ask your health care provider how often you should have your eyes and teeth checked. Stay up to date on all vaccines. This information is not intended to replace advice given to you by your health care provider. Make sure you discuss any questions you have with your health care provider. Document Revised: 03/25/2021 Document Reviewed: 03/25/2021 Elsevier Patient Education  2024 [employer].

## 2024-06-20 NOTE — Progress Notes (Signed)
 Subjective:    Patient ID: Felicia Lawrence, female    DOB: 1990-10-26, 33 y.o.   MRN: [id number]  HPI  Pt in for wellness exam   Pt is fasting.    Pt works as Radiation protection practitioner. Exercise 3-4 times a week(pt gets 10,000 steps a day). Eating healthy for most part. Soda mini 2-3 week but sprite. Non smoker. No alcohol.   Pt last pap beginning of this year. Result was normal. Asking pt to get us  copy so [facility] will be updated/   Declines flu vaccine.  Pt not sure if she she wants hep b surface antibody presently. Cost concerns if covered?   Up to date on tetanus.  Hpv vaccines done in the past.  Pt wants to loose weight. Counseled on diet and exercise.(No family history of hypothyroid)     Review of Systems  Constitutional:  Negative for chills, fatigue and fever.  HENT:  Negative for congestion, ear discharge and ear pain.   Respiratory:  Negative for cough, chest tightness, shortness of breath and wheezing.   Cardiovascular:  Negative for chest pain and palpitations.  Gastrointestinal:  Negative for abdominal pain and nausea.  Genitourinary:  Negative for dysuria, flank pain and hematuria.  Musculoskeletal:  Negative for back pain and myalgias.  Skin:  Negative for rash.  Neurological:  Negative for dizziness, syncope, weakness and light-headedness.  Hematological:  Negative for adenopathy.  Psychiatric/Behavioral:  Negative for behavioral problems, decreased concentration and hallucinations.     Past Medical History:  Diagnosis Date   Asthma    only as a child.   DVT (deep venous thrombosis) (HCC)    Urticaria      Social History   Socioeconomic History   Marital status: Single    Spouse name: Not on file   Number of children: Not on file   Years of education: Not on file   Highest education level: 12th grade  Occupational History   Not on file  Tobacco Use   Smoking status: Never    Passive exposure: Never   Smokeless tobacco: Never  Vaping Use    Vaping status: Never Used  Substance and Sexual Activity   Alcohol use: No   Drug use: No   Sexual activity: Yes    Birth control/protection: None  Other Topics Concern   Not on file  Social History Narrative   Not on file   Social Drivers of Health   Financial Resource Strain: Low Risk  (04/04/2023)   Overall Financial Resource Strain (CARDIA)    Difficulty of Paying Living Expenses: Not hard at all  Food Insecurity: No Food Insecurity (04/04/2023)   Hunger Vital Sign    Worried About Running Out of Food in the Last Year: Never true    Ran Out of Food in the Last Year: Never true  Transportation Needs: No Transportation Needs (04/04/2023)   PRAPARE - Administrator, Civil Service (Medical): No    Lack of Transportation (Non-Medical): No  Physical Activity: Sufficiently Active (04/04/2023)   Exercise Vital Sign    Days of Exercise per Week: 5 days    Minutes of Exercise per Session: 30 min  Stress: No Stress Concern Present (04/04/2023)   [employer] of Occupational Health - Occupational Stress Questionnaire    Feeling of Stress : Not at all  Social Connections: Unknown (04/04/2023)   Social Connection and Isolation Panel    Frequency of Communication with Friends and Family: More than three times  a week    Frequency of Social Gatherings with Friends and Family: Once a week    Attends Religious Services: Patient declined    Database administrator or Organizations: No    Attends Engineer, structural: Not on file    Marital Status: Never married  Catering manager Violence: Not on file    Past Surgical History:  Procedure Laterality Date   TONSILLECTOMY      Family History  Problem Relation Age of Onset   Clotting disorder Mother    Eczema Sister     No Known Allergies  Current Outpatient Medications on File Prior to Visit  Medication Sig Dispense Refill   cetirizine  (ZYRTEC ) 10 MG tablet Take 1 tablet (10 mg total) by mouth daily. 100 tablet  5   EPINEPHrine  0.3 mg/0.3 mL IJ SOAJ injection Inject 0.3 mg into the muscle as needed for anaphylaxis. 2 each 1   hydrOXYzine  (ATARAX ) 10 MG tablet Take 1 - 2 tablets by mouth every 8 hours as needed for itching/rash 30 tablet 0   nystatin  cream (MYCOSTATIN ) Apply 1 application topically 2 (two) times daily. 30 g 1   rivaroxaban  (XARELTO ) 20 MG TABS tablet TAKE 1 TABLET BY MOUTH ONCE DAILY WITH SUPPER 30 tablet 6   triamcinolone  (KENALOG ) 0.025 % ointment Apply 1 Application topically 2 (two) times daily. 30 g 0   XOLAIR  150 MG/ML prefilled syringe INJECT 2 SYRINGES SUBCUTANEOUSLY EVERY 4 WEEKS 2 mL 11   Loratadine  10 MG CAPS Take 10 mg by mouth daily. (Patient not taking: Reported on 06/20/2024)     methylPREDNISolone  (MEDROL  DOSEPAK) 4 MG TBPK tablet Take as directed on package, 6-day taper. (Patient not taking: Reported on 06/20/2024) 21 tablet 0   Current Facility-Administered Medications on File Prior to Visit  Medication Dose Route Frequency Provider Last Rate Last Admin   omalizumab  (XOLAIR ) prefilled syringe 300 mg  300 mg Subcutaneous Q28 days Iva Marty Saltness, MD   300 mg at 06/06/24 1349    BP 122/82   Temp 97.7 F (36.5 C) (Oral)   Resp 16   Ht 5' 2 (1.575 m)   Wt 209 lb 3.2 oz (94.9 kg)   LMP 06/08/2024 (Exact Date)   BMI 38.26 kg/m        Objective:   Physical Exam  General Mental Status- Alert. General Appearance- Not in acute distress.   Skin General: Color- Normal Color. Moisture- Normal Moisture.  Neck Carotid Arteries- Normal color. Moisture- Normal Moisture. No carotid bruits. No JVD.  Chest and Lung Exam Auscultation: Breath Sounds:-Normal.  Cardiovascular Auscultation:Rythm- Regular. Murmurs & Other Heart Sounds:Auscultation of the heart reveals- No Murmurs.  Abdomen Inspection:-Inspeection Normal. Palpation/Percussion:Note:No mass. Palpation and Percussion of the abdomen reveal- Non Tender, Non Distended + BS, no rebound or  guarding.   Neurologic Cranial Nerve exam:- CN III-XII intact(No nystagmus), symmetric smile. Strength:- 5/5 equal and symmetric strength both upper and lower extremities.       Assessment & Plan:   Patient Instructions  For you wellness exam today I have ordered cbc, cmp andlipid panel.   Declines flu vaccine.  Pt not sure if she she wants hep b surface antibody presently. Cost concerns if covered? Can ask your insurance.   Up to date on tetanus.  Please get us  copies of hpv vaccine records and last pap  Recommend exercise and healthy diet. Consider [employer] app or my fitness pal along with exercise for your desired wt loss.  We will let  you know lab results as they come in.  Follow up date appointment will be determined after lab review.    Holding form until labs back then will fill out for work wellness program     [employer], PA-C

## 2024-06-21 ENCOUNTER — Ambulatory Visit: Payer: Self-pay | Admitting: Medical

## 2024-07-02 NOTE — Progress Notes (Unsigned)
 [facility] [address] [place] [address] Phone: [phone] Subjective:   ISusannah Lawrence, am serving as a scribe for Dr. Arthea Claudene.  I'm seeing this patient by the request  of:  [place], [place] RIGGERS  CC: left thigh pain   YEP:Dlagzrupcz  Felicia Lawrence is a 33 y.o. female coming in with complaint of L thigh pain since June. Pain has stayed about the same. Can barely walk. Feels very stiff. Tries stretching and helps a little. Massages also help.   Patient did have x-ray done by primary care provider.  Independently visualized, showing no significant    Past Medical History:  Diagnosis Date   Asthma    only as a child.   DVT (deep venous thrombosis) (HCC)    Urticaria    Past Surgical History:  Procedure Laterality Date   TONSILLECTOMY     Social History   Socioeconomic History   Marital status: Single    Spouse name: Not on file   Number of children: Not on file   Years of education: Not on file   Highest education level: 12th grade  Occupational History   Not on file  Tobacco Use   Smoking status: Never    Passive exposure: Never   Smokeless tobacco: Never  Vaping Use   Vaping status: Never Used  Substance and Sexual Activity   Alcohol use: No   Drug use: No   Sexual activity: Yes    Birth control/protection: None  Other Topics Concern   Not on file  Social History Narrative   Not on file   Social Drivers of Health   Financial Resource Strain: Low Risk  (04/04/2023)   Overall Financial Resource Strain (CARDIA)    Difficulty of Paying Living Expenses: Not hard at all  Food Insecurity: No Food Insecurity (04/04/2023)   Hunger Vital Sign    Worried About Running Out of Food in the Last Year: Never true    Ran Out of Food in the Last Year: Never true  Transportation Needs: No Transportation Needs (04/04/2023)   PRAPARE - Administrator, Civil Service (Medical): No    Lack of Transportation  (Non-Medical): No  Physical Activity: Sufficiently Active (04/04/2023)   Exercise Vital Sign    Days of Exercise per Week: 5 days    Minutes of Exercise per Session: 30 min  Stress: No Stress Concern Present (04/04/2023)   [employer] of Occupational Health - Occupational Stress Questionnaire    Feeling of Stress : Not at all  Social Connections: Unknown (04/04/2023)   Social Connection and Isolation Panel    Frequency of Communication with Friends and Family: More than three times a week    Frequency of Social Gatherings with Friends and Family: Once a week    Attends Religious Services: Patient declined    Database administrator or Organizations: No    Attends Engineer, structural: Not on file    Marital Status: Never married   No Known Allergies Family History  Problem Relation Age of Onset   Clotting disorder Mother    Eczema Sister     Current Outpatient Medications (Endocrine & Metabolic):    methylPREDNISolone  (MEDROL  DOSEPAK) 4 MG TBPK tablet, Take as directed on package, 6-day taper. (Patient not taking: Reported on 06/20/2024)   Current Outpatient Medications (Cardiovascular):    EPINEPHrine  0.3 mg/0.3 mL IJ SOAJ injection, Inject 0.3 mg into the muscle as needed for anaphylaxis.  Current Outpatient Medications (Respiratory):    cetirizine  (ZYRTEC ) 10 MG tablet, Take 1 tablet (10 mg total) by mouth daily.   Loratadine  10 MG CAPS, Take 10 mg by mouth daily. (Patient not taking: Reported on 06/20/2024)   XOLAIR  150 MG/ML prefilled syringe, INJECT 2 SYRINGES SUBCUTANEOUSLY EVERY 4 WEEKS  Current Facility-Administered Medications (Respiratory):    omalizumab  (XOLAIR ) prefilled syringe 300 mg    Current Outpatient Medications (Hematological):    rivaroxaban  (XARELTO ) 20 MG TABS tablet, TAKE 1 TABLET BY MOUTH ONCE DAILY WITH SUPPER   Current Outpatient Medications (Other):    hydrOXYzine  (ATARAX ) 10 MG tablet, Take 1 - 2 tablets by mouth every 8 hours as  needed for itching/rash   nystatin  cream (MYCOSTATIN ), Apply 1 application topically 2 (two) times daily.   triamcinolone  (KENALOG ) 0.025 % ointment, Apply 1 Application topically 2 (two) times daily.    Reviewed prior external information including notes and imaging from  primary care provider As well as notes that were available from care everywhere and other healthcare systems.  Past medical history, social, surgical and family history all reviewed in electronic medical record.  No pertanent information unless stated regarding to the chief complaint.   Review of Systems:  No headache, visual changes, nausea, vomiting, diarrhea, constipation, dizziness, abdominal pain, skin rash, fevers, chills, night sweats, weight loss, swollen lymph nodes, body aches, joint swelling, chest pain, shortness of breath, mood changes. POSITIVE muscle aches  Objective  Blood pressure 110/74, pulse 87, height 5' 2 (1.575 m), weight 207 lb (93.9 kg), last menstrual period 06/08/2024, SpO2 97%.   General: No apparent distress alert and oriented x3 mood and affect normal, dressed appropriately.  HEENT: Pupils equal, extraocular movements intact  Respiratory: Patient's speak in full sentences and does not appear short of breath  Cardiovascular: No lower extremity edema, non tender, no erythema  Gait noted.  Patient does have some limitation in range of motion with internal and external left hip compared to the contralateral side.  Positive grind test noted patient does have a positive test in the left leg.  Symptoms about mid femur area.  Negative straight leg test    Impression and Recommendations:    The above documentation has been reviewed and is accurate and complete Addisson Frate M Sharnese Heath, DO

## 2024-07-03 ENCOUNTER — Ambulatory Visit (INDEPENDENT_AMBULATORY_CARE_PROVIDER_SITE_OTHER)

## 2024-07-03 ENCOUNTER — Ambulatory Visit: Admitting: Family Medicine

## 2024-07-03 VITALS — BP 110/74 | HR 87 | Ht 62.0 in | Wt 207.0 lb

## 2024-07-03 DIAGNOSIS — M25552 Pain in left hip: Secondary | ICD-10-CM | POA: Diagnosis not present

## 2024-07-03 DIAGNOSIS — M79605 Pain in left leg: Secondary | ICD-10-CM | POA: Diagnosis not present

## 2024-07-03 NOTE — Assessment & Plan Note (Addendum)
 Patient does have left hip pain.  Seems to be significantly out of proportion.  Has been going on for greater than 4 months and worsening.  Having difficulty with daily activities at this point.  Rates the severity of pain is out of 10 at this time.  Stops the patient from certain exercises as well as affects daily activities.  Concern with patient having positive grind test but also a positive fulcrum test differential includes labral pathology as well as the possibility of a stress reaction.  Patient is on chronic blood thinners and do need to rule out AVN.  Patient has done home exercises, prednisone , and do feel at this point advanced imaging is warranted and MRI of the femur as well as MR arthrogram of the hip ordered today to further evaluate.  Depending on findings we will discuss further treatment options.  Follow-up again after imaging to discuss further

## 2024-07-03 NOTE — Patient Instructions (Addendum)
Xrays today [place] [phone] Call Today  When we receive your results we will contact you.

## 2024-07-04 ENCOUNTER — Ambulatory Visit (INDEPENDENT_AMBULATORY_CARE_PROVIDER_SITE_OTHER)

## 2024-07-04 DIAGNOSIS — L501 Idiopathic urticaria: Secondary | ICD-10-CM | POA: Diagnosis not present

## 2024-07-19 ENCOUNTER — Encounter: Payer: Self-pay | Admitting: Family Medicine

## 2024-07-20 ENCOUNTER — Other Ambulatory Visit (HOSPITAL_BASED_OUTPATIENT_CLINIC_OR_DEPARTMENT_OTHER): Payer: Self-pay

## 2024-07-20 ENCOUNTER — Ambulatory Visit: Admitting: Medical

## 2024-07-20 MED ORDER — PREDNISONE 20 MG PO TABS
40.0000 mg | ORAL_TABLET | Freq: Every day | ORAL | 0 refills | Status: AC
  Filled 2024-07-20: qty 10, 5d supply, fill #0

## 2024-07-23 ENCOUNTER — Other Ambulatory Visit (HOSPITAL_BASED_OUTPATIENT_CLINIC_OR_DEPARTMENT_OTHER): Payer: Self-pay

## 2024-07-31 NOTE — Progress Notes (Unsigned)
 Follow Up Note  RE: Felicia Lawrence MRN: [id number] DOB: [birth year] Date of Office Visit: 08/01/2024  Referring provider: Dorina [place] RIGGERS Primary care provider: Dorina [place], PA-C  Chief Complaint: No chief complaint on file.  History of Present Illness: I had the pleasure of seeing Felicia Lawrence for a follow up visit at the [facility] on 08/01/2024. She is a 33 y.o. female, who is being followed for chronic urticaria on Xolair  and allergic rhinitis. Her previous allergy  office visit was on 04/30/2023 with Wanda Craze FNP. Today is a regular follow up visit.  Discussed the use of AI scribe software for clinical note transcription with the patient, who gave verbal consent to proceed.  History of Present Illness            ***  Assessment and Plan: Felicia Lawrence is a 33 y.o. female with: Chronic idiopathic urticaria ***  Seasonal and perennial allergic rhinitis ***  - Your history does not have any red flags such as fevers, joint pains, or permanent skin changes that would be concerning for a more serious cause of hives.  - continue Xolair  injections and have access to your epinephrine  auto injector device. Prescription sent. Demonstration given. Emergency Action Plan given  - Continue Zyrtec  10 mg or Claritin  10 mg daily   2. Seasonal and perennial allergic rhinitis - Testing on 04/08/22 showed: grasses, ragweed, weeds, indoor molds, outdoor molds, dust mites, and cockroach.  - continue avoidance measures  - Continue taking: antihistamines as above for your hives - You can use an extra dose of the antihistamine, if needed, for breakthrough symptoms.  - Consider nasal saline rinses 1-2 times daily to remove allergens from the nasal cavities as well as help with mucous clearance (this is especially helpful to do before the nasal sprays are given) - I do not think that we need allergy  shots, but we can reconsider in the future if  needed. Assessment and Plan              No follow-ups on file.  No orders of the defined types were placed in this encounter.  Lab Orders  No laboratory test(s) ordered today    Diagnostics: Spirometry:  Tracings reviewed. Her effort: {Blank single:19197::Good reproducible efforts.,It was hard to get consistent efforts and there is a question as to whether this reflects a maximal maneuver.,Poor effort, data can not be interpreted.} FVC: ***L FEV1: ***L, ***% predicted FEV1/FVC ratio: ***% Interpretation: {Blank single:19197::Spirometry consistent with mild obstructive disease,Spirometry consistent with moderate obstructive disease,Spirometry consistent with severe obstructive disease,Spirometry consistent with possible restrictive disease,Spirometry consistent with mixed obstructive and restrictive disease,Spirometry uninterpretable due to technique,Spirometry consistent with normal pattern,No overt abnormalities noted given today's efforts}.  Please see scanned spirometry results for details.  Skin Testing: {Blank single:19197::Select foods,Environmental allergy  panel,Environmental allergy  panel and select foods,Food allergy  panel,None,Deferred due to recent antihistamines use}. *** Results discussed with patient/family.   Medication List:  Current Outpatient Medications  Medication Sig Dispense Refill   cetirizine  (ZYRTEC ) 10 MG tablet Take 1 tablet (10 mg total) by mouth daily. 100 tablet 5   EPINEPHrine  0.3 mg/0.3 mL IJ SOAJ injection Inject 0.3 mg into the muscle as needed for anaphylaxis. 2 each 1   hydrOXYzine  (ATARAX ) 10 MG tablet Take 1 - 2 tablets by mouth every 8 hours as needed for itching/rash 30 tablet 0   Loratadine  10 MG CAPS Take 10 mg by mouth daily. (Patient not taking: Reported on 06/20/2024)  methylPREDNISolone  (MEDROL  DOSEPAK) 4 MG TBPK tablet Take as directed on package, 6-day taper. (Patient not taking: Reported on  06/20/2024) 21 tablet 0   nystatin  cream (MYCOSTATIN ) Apply 1 application topically 2 (two) times daily. 30 g 1   rivaroxaban  (XARELTO ) 20 MG TABS tablet TAKE 1 TABLET BY MOUTH ONCE DAILY WITH SUPPER 30 tablet 6   triamcinolone  (KENALOG ) 0.025 % ointment Apply 1 Application topically 2 (two) times daily. 30 g 0   XOLAIR  150 MG/ML prefilled syringe INJECT 2 SYRINGES SUBCUTANEOUSLY EVERY 4 WEEKS 2 mL 11   Current Facility-Administered Medications  Medication Dose Route Frequency Provider Last Rate Last Admin   omalizumab  (XOLAIR ) prefilled syringe 300 mg  300 mg Subcutaneous Q28 days Iva Marty Saltness, MD   300 mg at 07/04/24 1125   Allergies: No Known Allergies I reviewed her past medical history, social history, family history, and environmental history and no significant changes have been reported from her previous visit.  Review of Systems  Constitutional:  Negative for appetite change, chills, fever and unexpected weight change.  HENT:  Negative for congestion and rhinorrhea.   Eyes:  Negative for itching.  Respiratory:  Negative for cough, chest tightness, shortness of breath and wheezing.   Cardiovascular:  Negative for chest pain.  Gastrointestinal:  Negative for abdominal pain.  Genitourinary:  Negative for difficulty urinating.  Skin:  Negative for rash.  Allergic/Immunologic: Positive for environmental allergies.  Neurological:  Negative for headaches.    Objective: There were no vitals taken for this visit. There is no height or weight on file to calculate BMI. Physical Exam Vitals and nursing note reviewed.  Constitutional:      Appearance: Normal appearance. She is well-developed.  HENT:     Head: Normocephalic and atraumatic.     Right Ear: Tympanic membrane and external ear normal.     Left Ear: Tympanic membrane and external ear normal.     Nose: Nose normal.     Mouth/Throat:     Mouth: Mucous membranes are moist.     Pharynx: Oropharynx is clear.  Eyes:      Conjunctiva/sclera: Conjunctivae normal.  Cardiovascular:     Rate and Rhythm: Normal rate and regular rhythm.     Heart sounds: Normal heart sounds. No murmur heard.    No friction rub. No gallop.  Pulmonary:     Effort: Pulmonary effort is normal.     Breath sounds: Normal breath sounds. No wheezing, rhonchi or rales.  Musculoskeletal:     Cervical back: Neck supple.  Skin:    General: Skin is warm.     Findings: No rash.  Neurological:     Mental Status: She is alert and oriented to person, place, and time.  Psychiatric:        Behavior: Behavior normal.    Previous notes and tests were reviewed. The plan was reviewed with the patient/family, and all questions/concerned were addressed.  It was my pleasure to see Felicia Lawrence today and participate in her care. Please feel free to contact me with any questions or concerns.  Sincerely,  Orlan Cramp, DO Allergy  & Immunology  [facility]  [place] office: [phone] [facility] office: [phone]

## 2024-08-01 ENCOUNTER — Ambulatory Visit: Admitting: *Deleted

## 2024-08-01 ENCOUNTER — Encounter: Payer: Self-pay | Admitting: Allergy

## 2024-08-01 ENCOUNTER — Ambulatory Visit: Admitting: Allergy

## 2024-08-01 VITALS — BP 112/68 | HR 78 | Temp 98.5°F | Ht 63.0 in | Wt 210.0 lb

## 2024-08-01 DIAGNOSIS — J3089 Other allergic rhinitis: Secondary | ICD-10-CM

## 2024-08-01 DIAGNOSIS — L501 Idiopathic urticaria: Secondary | ICD-10-CM | POA: Diagnosis not present

## 2024-08-01 DIAGNOSIS — J302 Other seasonal allergic rhinitis: Secondary | ICD-10-CM

## 2024-08-01 NOTE — Patient Instructions (Addendum)
 Urticaria Continue Xolair  300mg  every 4 weeks injections - given today.  Continue proper skin care. Continue zyrtec  10-20mg  daily as needed.  Send a mychart message when you need your epipen  refilled.   Allergic rhinitis 2023 testing positive to grass, ragweed, weed, mold, dust mites, cockroach. Continue environmental control measures as below. Use over the counter antihistamines such as Zyrtec  (cetirizine ), Claritin  (loratadine ), Allegra (fexofenadine), or Xyzal (levocetirizine) daily as needed. May take twice a day during allergy  flares. May switch antihistamines every few months.  Follow up in 6 months or sooner if needed.   Reducing Pollen Exposure Pollen seasons: trees (spring), grass (summer) and ragweed/weeds (fall). Keep windows closed in your home and car to lower pollen exposure.  Install air conditioning in the bedroom and throughout the house if possible.  Avoid going out in dry windy days - especially early morning. Pollen counts are highest between 5 - 10 AM and on dry, hot and windy days.  Save outside activities for late afternoon or after a heavy rain, when pollen levels are lower.  Avoid mowing of grass if you have grass pollen allergy . Be aware that pollen can also be transported indoors on people and pets.  Dry your clothes in an automatic dryer rather than hanging them outside where they might collect pollen.  Rinse hair and eyes before bedtime.  Mold Control Mold and fungi can grow on a variety of surfaces provided certain temperature and moisture conditions exist.  Outdoor molds grow on plants, decaying vegetation and soil. The major outdoor mold, Alternaria and Cladosporium, are found in very high numbers during hot and dry conditions. Generally, a late summer - fall peak is seen for common outdoor fungal spores. Rain will temporarily lower outdoor mold spore count, but counts rise rapidly when the rainy period ends. The most important indoor molds are Aspergillus  and Penicillium. Dark, humid and poorly ventilated basements are ideal sites for mold growth. The next most common sites of mold growth are the bathroom and the kitchen. Outdoor (Seasonal) Mold Control Use air conditioning and keep windows closed. Avoid exposure to decaying vegetation. Avoid leaf raking. Avoid grain handling. Consider wearing a face mask if working in moldy areas.  Indoor (Perennial) Mold Control  Maintain humidity below 50%. Get rid of mold growth on hard surfaces with water, detergent and, if necessary, 5% bleach (do not mix with other cleaners). Then dry the area completely. If mold covers an area more than 10 square feet, consider hiring an indoor environmental professional. For clothing, washing with soap and water is best. If moldy items cannot be cleaned and dried, throw them away. Remove sources e.g. contaminated carpets. Repair and seal leaking roofs or pipes. Using dehumidifiers in damp basements may be helpful, but empty the water and clean units regularly to prevent mildew from forming. All rooms, especially basements, bathrooms and kitchens, require ventilation and cleaning to deter mold and mildew growth. Avoid carpeting on concrete or damp floors, and storing items in damp areas.  Control of House Dust Mite Allergen Dust mite allergens are a common trigger of allergy  and asthma symptoms. While they can be found throughout the house, these microscopic creatures thrive in warm, humid environments such as bedding, upholstered furniture and carpeting. Because so much time is spent in the bedroom, it is essential to reduce mite levels there.  Encase pillows, mattresses, and box springs in special allergen-proof fabric covers or airtight, zippered plastic covers.  Bedding should be washed weekly in hot water (130 F)  and dried in a hot dryer. Allergen-proof covers are available for comforters and pillows that can't be regularly washed.  Wash the allergy -proof covers every  few months. Minimize clutter in the bedroom. Keep pets out of the bedroom.  Keep humidity less than 50% by using a dehumidifier or air conditioning. You can buy a humidity measuring device called a hygrometer to monitor this.  If possible, replace carpets with hardwood, linoleum, or washable area rugs. If that's not possible, vacuum frequently with a vacuum that has a HEPA filter. Remove all upholstered furniture and non-washable window drapes from the bedroom. Remove all non-washable stuffed toys from the bedroom.  Wash stuffed toys weekly.  Cockroach Allergen Avoidance Cockroaches are often found in the homes of densely populated urban areas, schools or commercial buildings, but these creatures can lurk almost anywhere. This does not mean that you have a dirty house or living area. Block all areas where roaches can enter the home. This includes crevices, wall cracks and windows.  Cockroaches need water to survive, so fix and seal all leaky faucets and pipes. Have an exterminator go through the house when your family and pets are gone to eliminate any remaining roaches. Keep food in lidded containers and put pet food dishes away after your pets are done eating. Vacuum and sweep the floor after meals, and take out garbage and recyclables. Use lidded garbage containers in the kitchen. Wash dishes immediately after use and clean under stoves, refrigerators or toasters where crumbs can accumulate. Wipe off the stove and other kitchen surfaces and cupboards regularly.

## 2024-08-04 ENCOUNTER — Other Ambulatory Visit

## 2024-08-18 ENCOUNTER — Ambulatory Visit
Admission: RE | Admit: 2024-08-18 | Discharge: 2024-08-18 | Disposition: A | Source: Ambulatory Visit | Attending: Family Medicine | Admitting: Family Medicine

## 2024-08-18 DIAGNOSIS — M79605 Pain in left leg: Secondary | ICD-10-CM

## 2024-08-18 MED ORDER — GADOPICLENOL 0.5 MMOL/ML IV SOLN
9.0000 mL | Freq: Once | INTRAVENOUS | Status: AC | PRN
  Administered 2024-08-18: 9 mL via INTRAVENOUS

## 2024-08-20 ENCOUNTER — Ambulatory Visit: Payer: Self-pay | Admitting: Family Medicine

## 2024-08-20 ENCOUNTER — Telehealth: Payer: Self-pay | Admitting: Family Medicine

## 2024-08-20 NOTE — Telephone Encounter (Signed)
 Patient called to follow up on the results of her MRI that she saw in MyChart. Advised that they have not been review by Dr Claudene yet.  Can you follow up with patient on next steps when Dr Claudene has looked at them?

## 2024-08-22 ENCOUNTER — Other Ambulatory Visit: Payer: Self-pay

## 2024-08-22 DIAGNOSIS — M545 Low back pain, unspecified: Secondary | ICD-10-CM

## 2024-08-29 ENCOUNTER — Ambulatory Visit (INDEPENDENT_AMBULATORY_CARE_PROVIDER_SITE_OTHER)

## 2024-08-29 DIAGNOSIS — L501 Idiopathic urticaria: Secondary | ICD-10-CM | POA: Diagnosis not present

## 2024-08-31 ENCOUNTER — Inpatient Hospital Stay: Attending: Hematology & Oncology

## 2024-08-31 ENCOUNTER — Encounter: Payer: Self-pay | Admitting: Family

## 2024-08-31 ENCOUNTER — Inpatient Hospital Stay: Admitting: Family

## 2024-08-31 ENCOUNTER — Other Ambulatory Visit (HOSPITAL_BASED_OUTPATIENT_CLINIC_OR_DEPARTMENT_OTHER): Payer: Self-pay

## 2024-08-31 VITALS — BP 104/70 | HR 63 | Temp 98.5°F | Resp 18 | Ht 63.0 in | Wt 201.1 lb

## 2024-08-31 DIAGNOSIS — D508 Other iron deficiency anemias: Secondary | ICD-10-CM

## 2024-08-31 DIAGNOSIS — I82402 Acute embolism and thrombosis of unspecified deep veins of left lower extremity: Secondary | ICD-10-CM

## 2024-08-31 DIAGNOSIS — D6859 Other primary thrombophilia: Secondary | ICD-10-CM

## 2024-08-31 DIAGNOSIS — Z86718 Personal history of other venous thrombosis and embolism: Secondary | ICD-10-CM

## 2024-08-31 LAB — CMP (CANCER CENTER ONLY)
ALT: 18 U/L (ref 0–44)
AST: 21 U/L (ref 15–41)
Albumin: 4.3 g/dL (ref 3.5–5.0)
Alkaline Phosphatase: 57 U/L (ref 38–126)
Anion gap: 10 (ref 5–15)
BUN: 11 mg/dL (ref 6–20)
CO2: 24 mmol/L (ref 22–32)
Calcium: 9 mg/dL (ref 8.9–10.3)
Chloride: 106 mmol/L (ref 98–111)
Creatinine: 0.59 mg/dL (ref 0.44–1.00)
GFR, Estimated: >60 mL/min (ref >60)
Glucose, Bld: 88 mg/dL (ref 70–99)
Potassium: 4.3 mmol/L (ref 3.5–5.1)
Sodium: 140 mmol/L (ref 135–145)
Total Bilirubin: 0.4 mg/dL (ref 0.0–1.2)
Total Protein: 7 g/dL (ref 6.5–8.1)

## 2024-08-31 LAB — CBC WITH DIFFERENTIAL (CANCER CENTER ONLY)
Abs Immature Granulocytes: 0.01 K/uL (ref 0.00–0.07)
Basophils Absolute: 0 K/uL (ref 0.0–0.1)
Basophils Relative: 0 %
Eosinophils Absolute: 0.1 K/uL (ref 0.0–0.5)
Eosinophils Relative: 1 %
HCT: 36.9 % (ref 36.0–46.0)
Hemoglobin: 12.4 g/dL (ref 12.0–15.0)
Immature Granulocytes: 0 %
Lymphocytes Relative: 25 %
Lymphs Abs: 1.2 K/uL (ref 0.7–4.0)
MCH: 29 pg (ref 26.0–34.0)
MCHC: 33.6 g/dL (ref 30.0–36.0)
MCV: 86.2 fL (ref 80.0–100.0)
Monocytes Absolute: 0.4 K/uL (ref 0.1–1.0)
Monocytes Relative: 9 %
Neutro Abs: 2.9 K/uL (ref 1.7–7.7)
Neutrophils Relative %: 65 %
Platelet Count: 237 K/uL (ref 150–400)
RBC: 4.28 MIL/uL (ref 3.87–5.11)
RDW: 11.9 % (ref 11.5–15.5)
WBC Count: 4.5 K/uL (ref 4.0–10.5)
nRBC: 0 % (ref 0.0–0.2)

## 2024-08-31 LAB — LACTATE DEHYDROGENASE: LDH: 148 U/L (ref 105–235)

## 2024-08-31 NOTE — Progress Notes (Signed)
 Hematology and Oncology Follow Up Visit  Felicia Lawrence [id number] 05-08-1991 33 y.o. 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 She is doing well and has no complaints at this time.  She is tolerating her Xarelto  nicely. She holds during her cycle due to heavy flow and this has worked for her.  She has occasional bruising but not in excess. No petechiae.  No fever, chills, n/v, cough, rash, dizziness, SOB, chest pain, palpitations, abdominal pain or changes in bowel or bladder habits.  No swelling, tenderness, numbness or tingling in her extremities.  No falls or syncope reported.  Appetite and hydration are good. Weight is stable at 201 lbs.   ECOG Performance Status: 0 - Asymptomatic  Medications:  Allergies as of 08/31/2024   No Known Allergies      Medication List        Accurate as of August 31, 2024 10:39 AM. If you have any questions, ask your nurse or doctor.          cetirizine  10 MG tablet Commonly known as: ZYRTEC  Take 1 tablet (10 mg total) by mouth daily.   EPINEPHrine  0.3 mg/0.3 mL Soaj injection Commonly known as: EPI-PEN Inject 0.3 mg into the muscle as needed for anaphylaxis.   nystatin  cream Commonly known as: MYCOSTATIN  Apply 1 application topically 2 (two) times daily.   triamcinolone  0.025 % ointment Commonly known as: KENALOG  Apply 1 Application topically 2 (two) times daily.   Xarelto  20 MG Tabs tablet Generic drug: rivaroxaban  TAKE 1 TABLET BY MOUTH ONCE DAILY WITH SUPPER   Xolair  150 MG/ML prefilled syringe Generic drug: omalizumab  INJECT 2 SYRINGES SUBCUTANEOUSLY EVERY 4 WEEKS        Allergies: No Known Allergies  Past Medical History, Surgical history, Social history, and  Family History were reviewed and updated.  Review of Systems: All other 10 point review of systems is negative.   Physical Exam:  height is 5' 3 (1.6 m) and weight is 201 lb 1.3 oz (91.2 kg). Her oral temperature is 98.5 F (36.9 C). Her blood pressure is 104/70 and her pulse is 63. Her respiration is 18 and oxygen saturation is 100%.   Wt Readings from Last 3 Encounters:  08/31/24 201 lb 1.3 oz (91.2 kg)  08/01/24 210 lb (95.3 kg)  07/03/24 207 lb (93.9 kg)    Ocular: Sclerae unicteric, pupils equal, round and reactive to light Ear-nose-throat: Oropharynx clear, dentition fair Lymphatic: No cervical or supraclavicular adenopathy Lungs no rales or rhonchi, good excursion bilaterally Heart regular rate and rhythm, no murmur appreciated Abd soft, nontender, positive bowel sounds MSK no focal spinal tenderness, no joint edema Neuro: non-focal, well-oriented, appropriate affect Breasts: Deferred   Lab Results  Component Value Date   WBC 4.5 08/31/2024   HGB 12.4 08/31/2024   HCT 36.9 08/31/2024   MCV 86.2 08/31/2024   PLT 237 08/31/2024   Lab Results  Component Value Date   FERRITIN 32 03/16/2024   IRON 46 03/16/2024   TIBC 428 03/16/2024   UIBC 382 03/16/2024   IRONPCTSAT 11 03/16/2024   Lab Results  Component Value Date   RBC 4.28 08/31/2024   No results found for: KPAFRELGTCHN, LAMBDASER, KAPLAMBRATIO No  results found for: IGGSERUM, IGA, IGMSERUM No results found for: STEPHANY CARLOTA BENSON MARKEL EARLA JOANNIE DOC VICK, SPEI   Chemistry      Component Value Date/Time   NA 142 06/20/2024 1146   K 4.0 06/20/2024 1146   CL 104 06/20/2024 1146   CO2 30 06/20/2024 1146   BUN 12 06/20/2024 1146   CREATININE 0.57 06/20/2024 1146   CREATININE 0.73 03/16/2024 1007      Component Value Date/Time   CALCIUM 9.1 06/20/2024 1146   ALKPHOS 50 06/20/2024 1146   AST 16 06/20/2024 1146   AST 17 03/16/2024 1007   ALT 17  06/20/2024 1146   ALT 16 03/16/2024 1007   BILITOT 0.3 06/20/2024 1146   BILITOT 0.3 03/16/2024 1007       Impression and Plan: Felicia Lawrence is a very pleasant 33 yo female with protein S deficiency and history of DVT in the right lower extremity in 2018 and now 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  She continues to do well on Xarelto . No changes to her regimen.  Hgb stable at 12.4.  Follow-up in 6 months.    Lauraine Pepper, NP 11/21/202510:39 AM

## 2024-09-04 ENCOUNTER — Ambulatory Visit
Admission: RE | Admit: 2024-09-04 | Discharge: 2024-09-04 | Disposition: A | Discharge status: Home / Self Care | Source: Ambulatory Visit | Attending: Family Medicine | Admitting: Family Medicine

## 2024-09-04 DIAGNOSIS — M545 Low back pain, unspecified: Secondary | ICD-10-CM

## 2024-09-12 ENCOUNTER — Telehealth: Payer: Self-pay | Admitting: Family Medicine

## 2024-09-12 NOTE — Telephone Encounter (Signed)
 Patient received her results for her MRI. She asked if someone could call her to discuss next steps. She said that she is having a lot of pain, especially at night, that is unbearable.   Please advise.

## 2024-09-26 ENCOUNTER — Ambulatory Visit

## 2024-09-26 DIAGNOSIS — L501 Idiopathic urticaria: Secondary | ICD-10-CM

## 2024-09-26 NOTE — Progress Notes (Unsigned)
 [facility] [address] [place] [address] Phone: [phone] Subjective:    I'm seeing this patient by the request  of:  [place] RIGGERS  CC:   YEP:Dlagzrupcz  07/03/2024 Patient does have left hip pain. Seems to be significantly out of proportion. Has been going on for greater than 4 months and worsening. Having difficulty with daily activities at this point. Rates the severity of pain is out of 10 at this time. Stops the patient from certain exercises as well as affects daily activities. Concern with patient having positive grind test but also a positive fulcrum test differential includes labral pathology as well as the possibility of a stress reaction. Patient is on chronic blood thinners and do need to rule out AVN. Patient has done home exercises, prednisone , and do feel at this point advanced imaging is warranted and MRI of the femur as well as MR arthrogram of the hip ordered today to further evaluate. Depending on findings we will discuss further treatment options. Follow-up again after imaging to discuss further   Update 10/01/2024 Felicia [facility] is a 33 y.o. female coming in with complaint of L leg pain. Patient states   MRI lumbar 09/04/2024 IMPRESSION: 1. Normal for age MRI appearance of the lumbar spine; No discrete disc herniation. No spinal stenosis. No convincing neural impingement.   MRI L hip 08/18/2024 IMPRESSION: Minimal insertional tendinosis of the gluteus medius and minimus tendons and tendinosis of the origin of the left hamstring. No significant tendon tear or myositis is present.   Very mild degenerative changes in the left hip without accelerated arthrosis or acute abnormality.  MRI L femur 08/18/2024 IMPRESSION: Mild tendinosis of the origin the left hamstring and to a lesser extent the right hamstring as above. No full-thickness tear. Correlation for hamstring symptoms. Minimal insertional tendinosis of  the gluteus medius and minimus tendons.   Otherwise, unremarkable MRI of the left femur.    Past Medical History:  Diagnosis Date   Asthma    only as a child.   DVT (deep venous thrombosis) (HCC)    Urticaria    Past Surgical History:  Procedure Laterality Date   TONSILLECTOMY     Social History   Socioeconomic History   Marital status: Single    Spouse name: Not on file   Number of children: Not on file   Years of education: Not on file   Highest education level: 12th grade  Occupational History   Not on file  Tobacco Use   Smoking status: Never    Passive exposure: Never   Smokeless tobacco: Never  Vaping Use   Vaping status: Never Used  Substance and Sexual Activity   Alcohol use: No   Drug use: No   Sexual activity: Yes    Birth control/protection: None  Other Topics Concern   Not on file  Social History Narrative   Not on file   Social Drivers of Health   Tobacco Use: Low Risk (08/31/2024)   Patient History    Smoking Tobacco Use: Never    Smokeless Tobacco Use: Never    Passive Exposure: Never  Financial Resource Strain: Low Risk (04/04/2023)   Overall Financial Resource Strain (CARDIA)    Difficulty of Paying Living Expenses: Not hard at all  Food Insecurity: No Food Insecurity (04/04/2023)   Hunger Vital Sign    Worried About Running Out of Food in the Last Year: Never true    Ran Out of Food in  the Last Year: Never true  Transportation Needs: No Transportation Needs (04/04/2023)   PRAPARE - Administrator, Civil Service (Medical): No    Lack of Transportation (Non-Medical): No  Physical Activity: Sufficiently Active (04/04/2023)   Exercise Vital Sign    Days of Exercise per Week: 5 days    Minutes of Exercise per Session: 30 min  Stress: No Stress Concern Present (04/04/2023)   [employer] of Occupational Health - Occupational Stress Questionnaire    Feeling of Stress : Not at all  Social Connections: Unknown (04/04/2023)    Social Connection and Isolation Panel    Frequency of Communication with Friends and Family: More than three times a week    Frequency of Social Gatherings with Friends and Family: Once a week    Attends Religious Services: Patient declined    Active Member of Clubs or Organizations: No    Attends Engineer, Structural: Not on file    Marital Status: Never married  Depression (PHQ2-9): Low Risk (08/31/2024)   Depression (PHQ2-9)    PHQ-2 Score: 0  Alcohol Screen: Low Risk (04/04/2023)   Alcohol Screen    Last Alcohol Screening Score (AUDIT): 3  Housing: Low Risk (04/04/2023)   Housing    Last Housing Risk Score: 0  Utilities: Not on file  Health Literacy: Not on file   Allergies[1] Family History  Problem Relation Age of Onset   Clotting disorder Mother    Eczema Sister     Current Outpatient Medications (Cardiovascular):    EPINEPHrine  0.3 mg/0.3 mL IJ SOAJ injection, Inject 0.3 mg into the muscle as needed for anaphylaxis.  Current Outpatient Medications (Respiratory):    cetirizine  (ZYRTEC ) 10 MG tablet, Take 1 tablet (10 mg total) by mouth daily.   XOLAIR  150 MG/ML prefilled syringe, INJECT 2 SYRINGES SUBCUTANEOUSLY EVERY 4 WEEKS  Current Facility-Administered Medications (Respiratory):    omalizumab  (XOLAIR ) prefilled syringe 300 mg  Current Outpatient Medications (Hematological):    rivaroxaban  (XARELTO ) 20 MG TABS tablet, TAKE 1 TABLET BY MOUTH ONCE DAILY WITH SUPPER  Current Outpatient Medications (Other):    nystatin  cream (MYCOSTATIN ), Apply 1 application topically 2 (two) times daily.   triamcinolone  (KENALOG ) 0.025 % ointment, Apply 1 Application topically 2 (two) times daily.   Reviewed prior external information including notes and imaging from  primary care provider As well as notes that were available from care everywhere and other healthcare systems.  Past medical history, social, surgical and family history all reviewed in electronic medical  record.  No pertanent information unless stated regarding to the chief complaint.   Review of Systems:  No headache, visual changes, nausea, vomiting, diarrhea, constipation, dizziness, abdominal pain, skin rash, fevers, chills, night sweats, weight loss, swollen lymph nodes, body aches, joint swelling, chest pain, shortness of breath, mood changes. POSITIVE muscle aches  Objective  There were no vitals taken for this visit.   General: No apparent distress alert and oriented x3 mood and affect normal, dressed appropriately.  HEENT: Pupils equal, extraocular movements intact  Respiratory: Patient's speak in full sentences and does not appear short of breath  Cardiovascular: No lower extremity edema, non tender, no erythema      Impression and Recommendations:           [1] No Known Allergies

## 2024-10-01 ENCOUNTER — Encounter: Payer: Self-pay | Admitting: Family Medicine

## 2024-10-01 ENCOUNTER — Ambulatory Visit: Admitting: Family Medicine

## 2024-10-01 VITALS — BP 122/70 | Ht 63.0 in | Wt 206.0 lb

## 2024-10-01 DIAGNOSIS — M25552 Pain in left hip: Secondary | ICD-10-CM

## 2024-10-01 DIAGNOSIS — M7602 Gluteal tendinitis, left hip: Secondary | ICD-10-CM

## 2024-10-01 NOTE — Assessment & Plan Note (Signed)
 MRI did not show any specific nerve root impingement.  Does have some degenerative disc disease that we will monitor.  At this point though given injection in the gluteal tendon and hopeful that this will make significant progress.  Discussed icing regimen and home exercises, increase activity slowly.  Discussed icing regimen.  Follow-up again in 6 to 8 weeks

## 2024-10-01 NOTE — Patient Instructions (Addendum)
 Injected hip today Ice in 6 hours Do prescribed exercises at least 3x a week starting next week See me again in 2-3 months

## 2024-10-01 NOTE — Assessment & Plan Note (Signed)
 Diagnosed on MRI.  Given injection under ultrasound guidance.  Tolerated the procedure well and was able to ambulate without any significant difficulty.  Hopeful that this will make a significant difference.  Discussed icing regimen and home exercises.  Follow-up with me again in 2 to 3 months  I personally spent a total of 33 minutes in the care of the patient today including preparing to see the patient, performing a medically appropriate exam/evaluation, counseling and educating, documenting clinical information in the EHR, independently interpreting results, and communicating results.

## 2024-10-17 ENCOUNTER — Encounter: Payer: Self-pay | Admitting: Family

## 2024-10-24 ENCOUNTER — Ambulatory Visit

## 2024-10-24 DIAGNOSIS — L501 Idiopathic urticaria: Secondary | ICD-10-CM | POA: Diagnosis not present

## 2024-11-02 ENCOUNTER — Other Ambulatory Visit (HOSPITAL_BASED_OUTPATIENT_CLINIC_OR_DEPARTMENT_OTHER): Payer: Self-pay

## 2024-11-05 ENCOUNTER — Other Ambulatory Visit: Payer: Self-pay | Admitting: *Deleted

## 2024-11-05 ENCOUNTER — Encounter: Payer: Self-pay | Admitting: Family

## 2024-11-05 ENCOUNTER — Other Ambulatory Visit (HOSPITAL_BASED_OUTPATIENT_CLINIC_OR_DEPARTMENT_OTHER): Payer: Self-pay

## 2024-11-05 ENCOUNTER — Other Ambulatory Visit: Payer: Self-pay

## 2024-11-05 ENCOUNTER — Encounter: Payer: Self-pay | Admitting: Allergy

## 2024-11-05 DIAGNOSIS — D6859 Other primary thrombophilia: Secondary | ICD-10-CM

## 2024-11-05 DIAGNOSIS — I82402 Acute embolism and thrombosis of unspecified deep veins of left lower extremity: Secondary | ICD-10-CM

## 2024-11-05 MED ORDER — RIVAROXABAN 20 MG PO TABS
20.0000 mg | ORAL_TABLET | Freq: Every day | ORAL | 6 refills | Status: AC
  Filled 2024-11-05: qty 30, 30d supply, fill #0

## 2024-11-06 ENCOUNTER — Other Ambulatory Visit: Payer: Self-pay

## 2024-11-06 ENCOUNTER — Other Ambulatory Visit (HOSPITAL_BASED_OUTPATIENT_CLINIC_OR_DEPARTMENT_OTHER): Payer: Self-pay

## 2024-11-06 ENCOUNTER — Other Ambulatory Visit: Payer: Self-pay | Admitting: *Deleted

## 2024-11-06 MED ORDER — EPINEPHRINE 0.3 MG/0.3ML IJ SOAJ
0.3000 mg | INTRAMUSCULAR | 1 refills | Status: AC | PRN
  Filled 2024-11-06: qty 2, 1d supply, fill #0

## 2024-11-13 ENCOUNTER — Other Ambulatory Visit (HOSPITAL_BASED_OUTPATIENT_CLINIC_OR_DEPARTMENT_OTHER): Payer: Self-pay

## 2024-11-13 MED ORDER — ATOVAQUONE-PROGUANIL HCL 250-100 MG PO TABS
1.0000 | ORAL_TABLET | Freq: Every day | ORAL | 0 refills | Status: AC
  Filled 2024-11-13: qty 17, 17d supply, fill #0

## 2024-11-13 MED ORDER — AZITHROMYCIN 500 MG PO TABS
1000.0000 mg | ORAL_TABLET | ORAL | 0 refills | Status: AC
  Filled 2024-11-13: qty 4, 3d supply, fill #0

## 2024-11-21 ENCOUNTER — Ambulatory Visit

## 2025-02-22 ENCOUNTER — Inpatient Hospital Stay: Admitting: Family

## 2025-02-22 ENCOUNTER — Inpatient Hospital Stay
